# Patient Record
Sex: Female | Born: 1990 | Race: White | Hispanic: No | Marital: Single | Smoking: Never smoker
Health system: Southern US, Community
[De-identification: ages and names within clinical notes are randomized; demographics above are authoritative.]

## PROBLEM LIST (undated history)

## (undated) DIAGNOSIS — E282 Polycystic ovarian syndrome: Secondary | ICD-10-CM

## (undated) DIAGNOSIS — K219 Gastro-esophageal reflux disease without esophagitis: Secondary | ICD-10-CM

## (undated) DIAGNOSIS — L0292 Furuncle, unspecified: Secondary | ICD-10-CM

---

## 2015-10-21 ENCOUNTER — Emergency Department (INDEPENDENT_AMBULATORY_CARE_PROVIDER_SITE_OTHER)
Admission: EM | Admit: 2015-10-21 | Discharge: 2015-10-21 | Disposition: A | Discharge status: Home / Self Care | Payer: Self-pay | Source: Home / Self Care

## 2015-10-21 ENCOUNTER — Encounter (HOSPITAL_COMMUNITY): Payer: Self-pay

## 2015-10-21 DIAGNOSIS — J029 Acute pharyngitis, unspecified: Secondary | ICD-10-CM

## 2015-10-21 LAB — POCT RAPID STREP A: Streptococcus, Group A Screen (Direct): NEGATIVE

## 2015-10-21 MED ORDER — AMOXICILLIN 250 MG PO CAPS: 250.0000 mg | ORAL_CAPSULE | Freq: Two times a day (BID) | ORAL | Status: DC

## 2015-10-21 NOTE — ED Notes (Signed)
PA eval only 

## 2015-10-21 NOTE — Discharge Instructions (Signed)

## 2015-10-21 NOTE — ED Provider Notes (Signed)
CSN: [id number]     Arrival date & time 10/21/15  1619 History   None    No chief complaint on file.  (Consider location/radiation/quality/duration/timing/severity/associated sxs/prior Treatment) HPI History obtained from patient:   LOCATION:throat SEVERITY: DURATION:since last night CONTEXT:runny nose over the last couple of days QUALITY: MODIFYING FACTORS: ASSOCIATED SYMPTOMS: Saturday morning vomiting  TIMING:now constant OCCUPATION:clerk at hotel  No past medical history on file. No past surgical history on file. No family history on file. Social History  Substance Use Topics  . Smoking status: Not on file  . Smokeless tobacco: Not on file  . Alcohol Use: Not on file   OB History    No data available     Review of Systems ROS +'ve sore throat  Denies: HEADACHE, NAUSEA, ABDOMINAL PAIN, CHEST PAIN, CONGESTION, DYSURIA, SHORTNESS OF BREATH  Allergies  Review of patient's allergies indicates not on file.  Home Medications   Prior to Admission medications   Not on File   Meds Ordered and Administered this Visit  Medications - No data to display  There were no vitals taken for this visit. No data found.   Physical Exam  Constitutional: She is oriented to person, place, and time. She appears well-developed and well-nourished.  HENT:  Head: Normocephalic and atraumatic.  Right Ear: External ear normal.  Left Ear: External ear normal.  Mouth/Throat: Oropharynx is clear and moist.  Eyes: Conjunctivae are normal.  Neck: Normal range of motion. Neck supple.  Pulmonary/Chest: Effort normal and breath sounds normal.  Abdominal: Soft.  Musculoskeletal: Normal range of motion.  Neurological: She is alert and oriented to person, place, and time.  Skin: Skin is warm and dry.  Psychiatric: She has a normal mood and affect. Her behavior is normal. Judgment and thought content normal.  Nursing note and vitals reviewed.   ED Course  Procedures (including critical  care time)  Labs Review Labs Reviewed  POCT RAPID STREP A    Imaging Review No results found.   Visual Acuity Review  Right Eye Distance:   Left Eye Distance:   Bilateral Distance:    Right Eye Near:   Left Eye Near:    Bilateral Near:         MDM   1. Pharyngitis   Patient is advised to continue home symptomatic treatment. Prescription for amoxil is provided to the patient. Patient is advised that if there are new or worsening symptoms or attend the emergency department, or contact primary care provider. Instructions of care provided discharged home in stable condition.  THIS NOTE WAS GENERATED USING A VOICE RECOGNITION SOFTWARE PROGRAM. ALL REASONABLE EFFORTS  WERE MADE TO PROOFREAD THIS DOCUMENT FOR ACCURACY.     Tharon AquasFrank C Patrick, [place] 10/21/15 2037

## 2015-10-24 LAB — CULTURE, GROUP A STREP

## 2016-09-22 ENCOUNTER — Encounter (HOSPITAL_COMMUNITY): Payer: Self-pay | Admitting: Emergency Medicine

## 2016-09-22 ENCOUNTER — Telehealth (HOSPITAL_COMMUNITY): Payer: Self-pay | Admitting: Emergency Medicine

## 2016-09-22 ENCOUNTER — Ambulatory Visit (HOSPITAL_COMMUNITY)
Admission: EM | Admit: 2016-09-22 | Discharge: 2016-09-22 | Disposition: A | Discharge status: Home / Self Care | Payer: BLUE CROSS/BLUE SHIELD | Attending: Emergency Medicine | Admitting: Emergency Medicine

## 2016-09-22 DIAGNOSIS — H6690 Otitis media, unspecified, unspecified ear: Secondary | ICD-10-CM | POA: Diagnosis not present

## 2016-09-22 MED ORDER — AMOXICILLIN 500 MG PO CAPS: 1000.0000 mg | ORAL_CAPSULE | Freq: Two times a day (BID) | ORAL | 0 refills | Status: DC

## 2016-09-22 NOTE — ED Triage Notes (Signed)
[facility] with a complaint of a sore throat that started 5 days ago that has now become bilateral ear pain x 2 days. The patient denied fever at home.

## 2016-09-22 NOTE — Discharge Instructions (Signed)
You have a sinus infection, and are developing ear infections. Take amoxicillin twice a day for 10 days. Use nasal saline spray and Flonase to help with the congestion. Mucinex will also help with the congestion. Hot or cold liquids, whichever feels better on her throat. Tylenol or ibuprofen as needed for aches. Follow up as needed.

## 2016-09-22 NOTE — Telephone Encounter (Signed)
Per Lincoln Brighamony, B, RN.... E-Rx medication to [facility] [address])  Alinda Moneyony notified pt here in office.

## 2016-09-22 NOTE — ED Provider Notes (Signed)
[facility]    CSN: [id number] Arrival date & time: 09/22/16  1315     History   Chief Complaint Chief Complaint  Patient presents with  . Otalgia    HPI Christine Heath is a 25 y.o. female.   HPI She is a 25 year old woman here for evaluation of ear pain. Her symptoms started about 5 days ago with nasal congestion, sinus drainage, and sore throat. Over the last day or 2, she has developed pain in both of her ears, left worse than right. She reports a mild cough, but no shortness of breath or wheezing. She reports a low-grade fever initially, but this has resolved.  History reviewed. No pertinent past medical history.  There are no active problems to display for this patient.   History reviewed. No pertinent surgical history.  OB History    No data available       Home Medications    Prior to Admission medications   Medication Sig Start Date End Date Taking? Authorizing Provider  amoxicillin (AMOXIL) 500 MG capsule Take 2 capsules (1,000 mg total) by mouth 2 (two) times daily. For 10 days 09/22/16   Charm RingsErin J Honig, MD    Family History History reviewed. No pertinent family history.  Social History Social History  Substance Use Topics  . Smoking status: Never Smoker  . Smokeless tobacco: Never Used  . Alcohol use No     Allergies   Patient has no known allergies.   Review of Systems Review of Systems As in history of present illness  Physical Exam Triage Vital Signs ED Triage Vitals [09/22/16 1409]  Enc Vitals Group     BP 109/74     Pulse Rate 100     Resp 18     Temp 98.3 F (36.8 C)     Temp Source Oral     SpO2 98 %     Weight      Height      Head Circumference      Peak Flow      Pain Score      Pain Loc      Pain Edu?      Excl. in GC?    No data found.   Updated Vital Signs BP 109/74 (BP Location: Left Arm)   Pulse 100   Temp 98.3 F (36.8 C) (Oral)   Resp 18   LMP 08/23/2016 (Exact Date)   SpO2 98%    Visual Acuity Right Eye Distance:   Left Eye Distance:   Bilateral Distance:    Right Eye Near:   Left Eye Near:    Bilateral Near:     Physical Exam  Constitutional: She is oriented to person, place, and time. She appears well-developed and well-nourished. No distress.  HENT:  Mouth/Throat: Oropharynx is clear and moist. No oropharyngeal exudate.  Nasal discharge present. Right TM with erythema to the superior aspect. Left TM is quite erythematous, but no fluid collection.  Neck: Neck supple.  Cardiovascular: Normal rate, regular rhythm and normal heart sounds.   No murmur heard. Pulmonary/Chest: Effort normal and breath sounds normal. No respiratory distress. She has no wheezes. She has no rales.  Lymphadenopathy:    She has no cervical adenopathy.  Neurological: She is alert and oriented to person, place, and time.     UC Treatments / Results  Labs (all labs ordered are listed, but only abnormal results are displayed) Labs Reviewed - No data to display  EKG  EKG Interpretation None       Radiology No results found.  Procedures Procedures (including critical care time)  Medications Ordered in UC Medications - No data to display   Initial Impression / Assessment and Plan / UC Course  I have reviewed the triage vital signs and the nursing notes.  Pertinent labs & imaging results that were available during my care of the patient were reviewed by me and considered in my medical decision making (see chart for details).  Clinical Course     Likely viral sinusitis with developing ear infections. Amoxicillin for 10 days. Discussed symptomatic treatment for nasal congestion. Follow up as needed.  Final Clinical Impressions(s) / UC Diagnoses   Final diagnoses:  Acute otitis media, unspecified otitis media type    New Prescriptions New Prescriptions   AMOXICILLIN (AMOXIL) 500 MG CAPSULE    Take 2 capsules (1,000 mg total) by mouth 2 (two) times daily. For  10 days     Charm RingsErin J Honig, MD 09/22/16 1450

## 2016-11-18 ENCOUNTER — Emergency Department (HOSPITAL_BASED_OUTPATIENT_CLINIC_OR_DEPARTMENT_OTHER): Payer: Self-pay

## 2016-11-18 ENCOUNTER — Emergency Department (HOSPITAL_BASED_OUTPATIENT_CLINIC_OR_DEPARTMENT_OTHER)
Admission: EM | Admit: 2016-11-18 | Discharge: 2016-11-18 | Disposition: A | Discharge status: Home / Self Care | Payer: Self-pay | Attending: Emergency Medicine | Admitting: Emergency Medicine

## 2016-11-18 ENCOUNTER — Encounter (HOSPITAL_BASED_OUTPATIENT_CLINIC_OR_DEPARTMENT_OTHER): Payer: Self-pay

## 2016-11-18 DIAGNOSIS — J181 Lobar pneumonia, unspecified organism: Secondary | ICD-10-CM | POA: Insufficient documentation

## 2016-11-18 DIAGNOSIS — J189 Pneumonia, unspecified organism: Secondary | ICD-10-CM

## 2016-11-18 MED ORDER — AZITHROMYCIN 250 MG PO TABS: 250.0000 mg | ORAL_TABLET | Freq: Every day | ORAL | 0 refills | Status: DC

## 2016-11-18 MED ORDER — CEFTRIAXONE SODIUM 1 G IJ SOLR
1.0000 g | Freq: Once | INTRAMUSCULAR | Status: AC
  Administered 2016-11-18: 1 g via INTRAMUSCULAR
  Filled 2016-11-18: qty 10

## 2016-11-18 MED ORDER — LIDOCAINE HCL (PF) 1 % IJ SOLN
INTRAMUSCULAR | Status: AC
  Filled 2016-11-18: qty 5

## 2016-11-18 MED ORDER — AZITHROMYCIN 250 MG PO TABS
500.0000 mg | ORAL_TABLET | Freq: Once | ORAL | Status: AC
  Administered 2016-11-18: 500 mg via ORAL
  Filled 2016-11-18: qty 2

## 2016-11-18 NOTE — Discharge Instructions (Signed)
See your Physician for recheck in 3-4 days °

## 2016-11-18 NOTE — ED Provider Notes (Signed)
[facility]   CSN: [id number] Arrival date & time: 11/18/16  1403  By signing my name below, I, Linna Darner, attest that this documentation has been prepared under the direction and in the presence of Langston Masker, [place]. Electronically Signed: Linna Darner, Scribe. 11/18/2016. 6:43 PM.  History   Chief Complaint Chief Complaint  Patient presents with  . Cough    The history is provided by the patient. No language interpreter was used.    HPI Comments: Christine Heath is a 26 y.o. female who presents to the Emergency Department complaining of a persistent cough beginning three days ago. She reports associated fever, chills, postnasal drip, body aches, and occasional wheezing. Pt notes she received the flu vaccination three days ago before her symptoms presented. No h/o breathing problems. No known contacts with similar symptoms. She denies nausea, vomiting, or any other associated symptoms.  History reviewed. No pertinent past medical history.  There are no active problems to display for this patient.   History reviewed. No pertinent surgical history.  OB History    No data available       Home Medications    Prior to Admission medications   Not on File    Family History No family history on file.  Social History Social History  Substance Use Topics  . Smoking status: Never Smoker  . Smokeless tobacco: Never Used  . Alcohol use No     Allergies   Patient has no known allergies.   Review of Systems Review of Systems  Constitutional: Positive for chills and fever.  HENT: Positive for postnasal drip.   Respiratory: Positive for cough and wheezing.   Musculoskeletal: Positive for myalgias.  All other systems reviewed and are negative.    Physical Exam Updated Vital Signs BP 133/78   Pulse 98   Temp 99.3 F (37.4 C)   Resp 16   Ht 5\' 5"  (1.651 m)   Wt 280 lb (127 kg)   LMP 11/15/2016   SpO2 99%   BMI 46.59 kg/m    Physical Exam  Constitutional: She is oriented to person, place, and time. She appears well-developed and well-nourished. No distress.  HENT:  Head: Normocephalic and atraumatic.  Eyes: Conjunctivae and EOM are normal.  Neck: Neck supple. No tracheal deviation present.  Cardiovascular: Normal rate.   Pulmonary/Chest: Effort normal. No respiratory distress. She has no wheezes. She has rhonchi.  Musculoskeletal: Normal range of motion.  Neurological: She is alert and oriented to person, place, and time.  Skin: Skin is warm and dry.  Psychiatric: She has a normal mood and affect. Her behavior is normal.  Nursing note and vitals reviewed.    ED Treatments / Results  Labs (all labs ordered are listed, but only abnormal results are displayed) Labs Reviewed - No data to display  EKG  EKG Interpretation None       Radiology No results found.  Procedures Procedures (including critical care time)  DIAGNOSTIC STUDIES: Oxygen Saturation is 99% on RA, normal by my interpretation.    COORDINATION OF CARE: 6:47 PM Discussed treatment plan with pt at bedside and pt agreed to plan.  Medications Ordered in ED Medications - No data to display   Initial Impression / Assessment and Plan / ED Course  I have reviewed the triage vital signs and the nursing notes.  Pertinent labs & imaging results that were available during my care of the patient were reviewed by me and considered in my  medical decision making (see chart for details).       Final Clinical Impressions(s) / ED Diagnoses   Final diagnoses:  Community acquired pneumonia of right upper lobe of lung (HCC)    New Prescriptions Discharge Medication List as of 11/18/2016  7:35 PM    START taking these medications   Details  azithromycin (ZITHROMAX) 250 MG tablet Take 1 tablet (250 mg total) by mouth daily. Take first 2 tablets together, then 1 every day until finished., Starting Wed 11/18/2016, Print       An  After Visit Summary was printed and given to the patient.  I personally performed the services in this documentation, which was scribed in my presence.  The recorded information has been reviewed and considered.   Barnet PallKaren SofiaPAC.   Lonia SkinnerLeslie K RaleighSofia, PA-C 11/19/16 1612    Loren Raceravid Yelverton, MD 11/20/16 [phone]

## 2016-11-18 NOTE — ED Triage Notes (Signed)
C/o cough x 3 days-NAD-steady gait 

## 2016-11-18 NOTE — ED Notes (Addendum)
Pt reports TMax of 100.3 last PM. Pt also reports generalized body aches.

## 2017-07-15 ENCOUNTER — Encounter (HOSPITAL_COMMUNITY): Payer: Self-pay | Admitting: Emergency Medicine

## 2017-07-15 ENCOUNTER — Ambulatory Visit (HOSPITAL_COMMUNITY)
Admission: EM | Admit: 2017-07-15 | Discharge: 2017-07-15 | Disposition: A | Discharge status: Home / Self Care | Payer: BLUE CROSS/BLUE SHIELD | Attending: Family Medicine | Admitting: Family Medicine

## 2017-07-15 DIAGNOSIS — H6091 Unspecified otitis externa, right ear: Secondary | ICD-10-CM

## 2017-07-15 MED ORDER — NEOMYCIN-POLYMYXIN-HC 3.5-10000-1 OT SUSP: 4.0000 [drp] | Freq: Three times a day (TID) | OTIC | 1 refills | Status: DC

## 2017-07-15 NOTE — ED Provider Notes (Signed)
  [facility]   [id number] 07/15/17 Arrival Time: 1555   SUBJECTIVE:  Christine Heath is a 26 y.o. female who presents to the urgent care with complaint of left ear canal pain. She has known at the left ear canal has had problems with otitis externa in the past.  She's currently taking Keflex for a abscess.  Patient works as a Production designer, theatre/television/film for a [employer].     History reviewed. No pertinent past medical history. No family history on file. Social History   Social History  . Marital status: Single    Spouse name: N/A  . Number of children: N/A  . Years of education: N/A   Occupational History  . Not on file.   Social History Main Topics  . Smoking status: Never Smoker  . Smokeless tobacco: Never Used  . Alcohol use No  . Drug use: No  . Sexual activity: Not on file   Other Topics Concern  . Not on file   Social History Narrative  . No narrative on file   No outpatient prescriptions have been marked as taking for the 07/15/17 encounter [facility] Encounter).   No Known Allergies    ROS: As per HPI, remainder of ROS negative.   OBJECTIVE:   Vitals:   07/15/17 1633 07/15/17 1635  BP:  122/76  Pulse:  91  Resp:  16  Temp:  99.1 F (37.3 C)  TempSrc:  Oral  SpO2:  100%  Weight: 280 lb (127 kg)   Height:  (1.651 m)      General appearance: alert; no distress Eyes: PERRL; EOMI; conjunctiva normal HENT: normocephalic; atraumatic; TMs normal, canal eczematous with erythema on right only, external ears without trauma; nasal mucosa normal; oral mucosa normal Neck: supple, tender right anterior cervical node Back: no CVA tenderness Extremities: no cyanosis or edema; symmetrical with no gross deformities Skin: warm and dry Neurologic: normal gait; grossly normal Psychological: alert and cooperative; normal mood and affect      Labs:  Results for orders placed or performed during the hospital encounter of 10/21/15  Culture, Group A Strep    Result Value Ref Range   Strep A Culture Comment (A)   POCT rapid strep A [facility])  Result Value Ref Range   Streptococcus, Group A Screen (Direct) NEGATIVE NEGATIVE    Labs Reviewed - No data to display  No results found.     ASSESSMENT & PLAN:  1. Otitis externa of right ear, unspecified chronicity, unspecified type     Meds ordered this encounter  Medications  . neomycin-polymyxin-hydrocortisone (CORTISPORIN) 3.5-10000-1 OTIC suspension    Sig: Place 4 drops into the right ear 3 (three) times daily.    Dispense:  10 mL    Refill:  1    Reviewed expectations re: course of current medical issues. Questions answered. Outlined signs and symptoms indicating need for more acute intervention. Patient verbalized understanding. After Visit Summary given.    Procedures:      Elvina Sidle, MD 07/15/17 [phone]

## 2017-07-15 NOTE — ED Triage Notes (Signed)
PT gets frequent ear infections. Right ear has been bothering her for 3-4 days.

## 2017-11-02 IMAGING — CR DG CHEST 2V
2 series · 2 of 2 positions shown · non-contrast
Comparison: None.

CLINICAL DATA: Acute onset of cough, fever and generalized chills.
Initial encounter.

EXAM:
CHEST  2 VIEW

[w chest pa *]
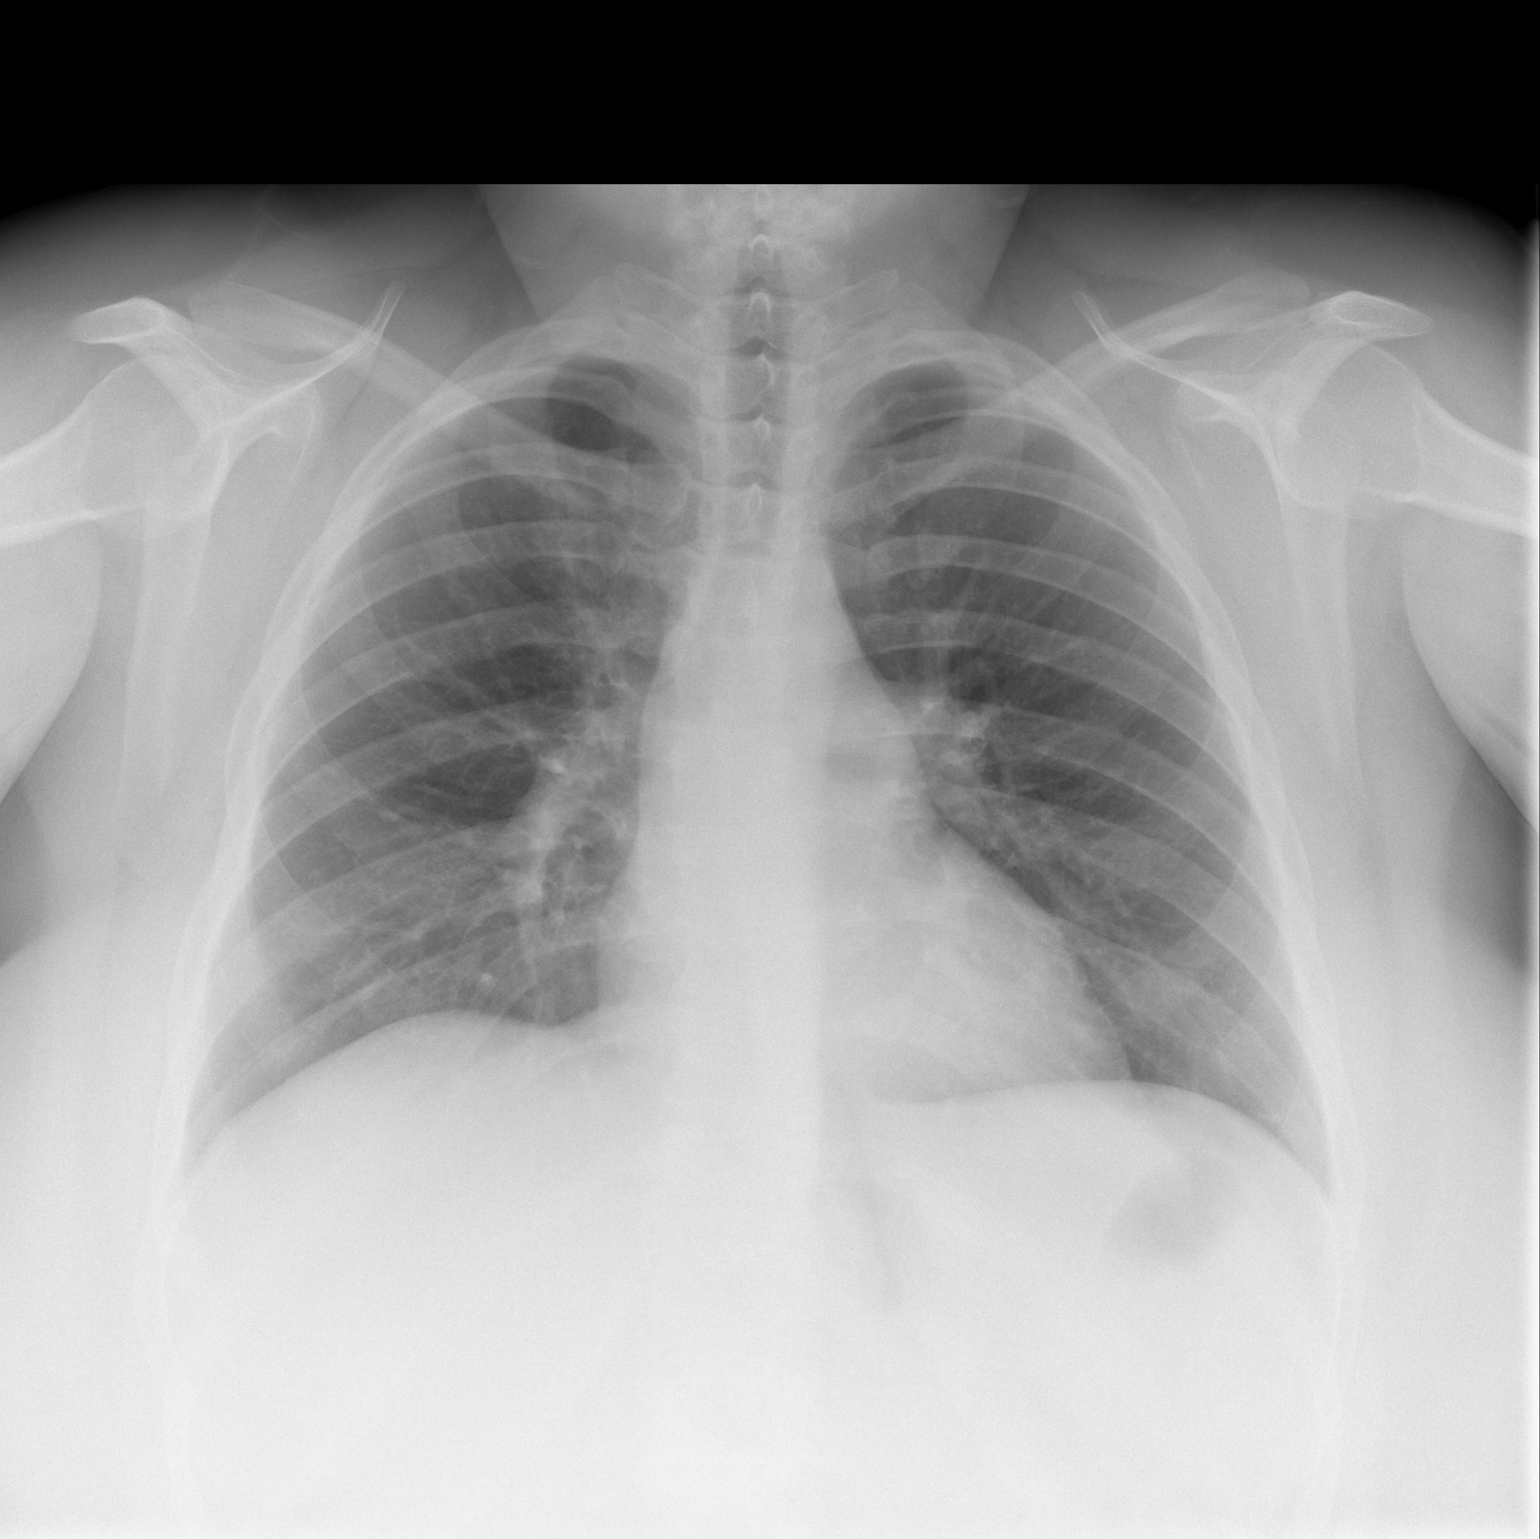

[w chest lat]
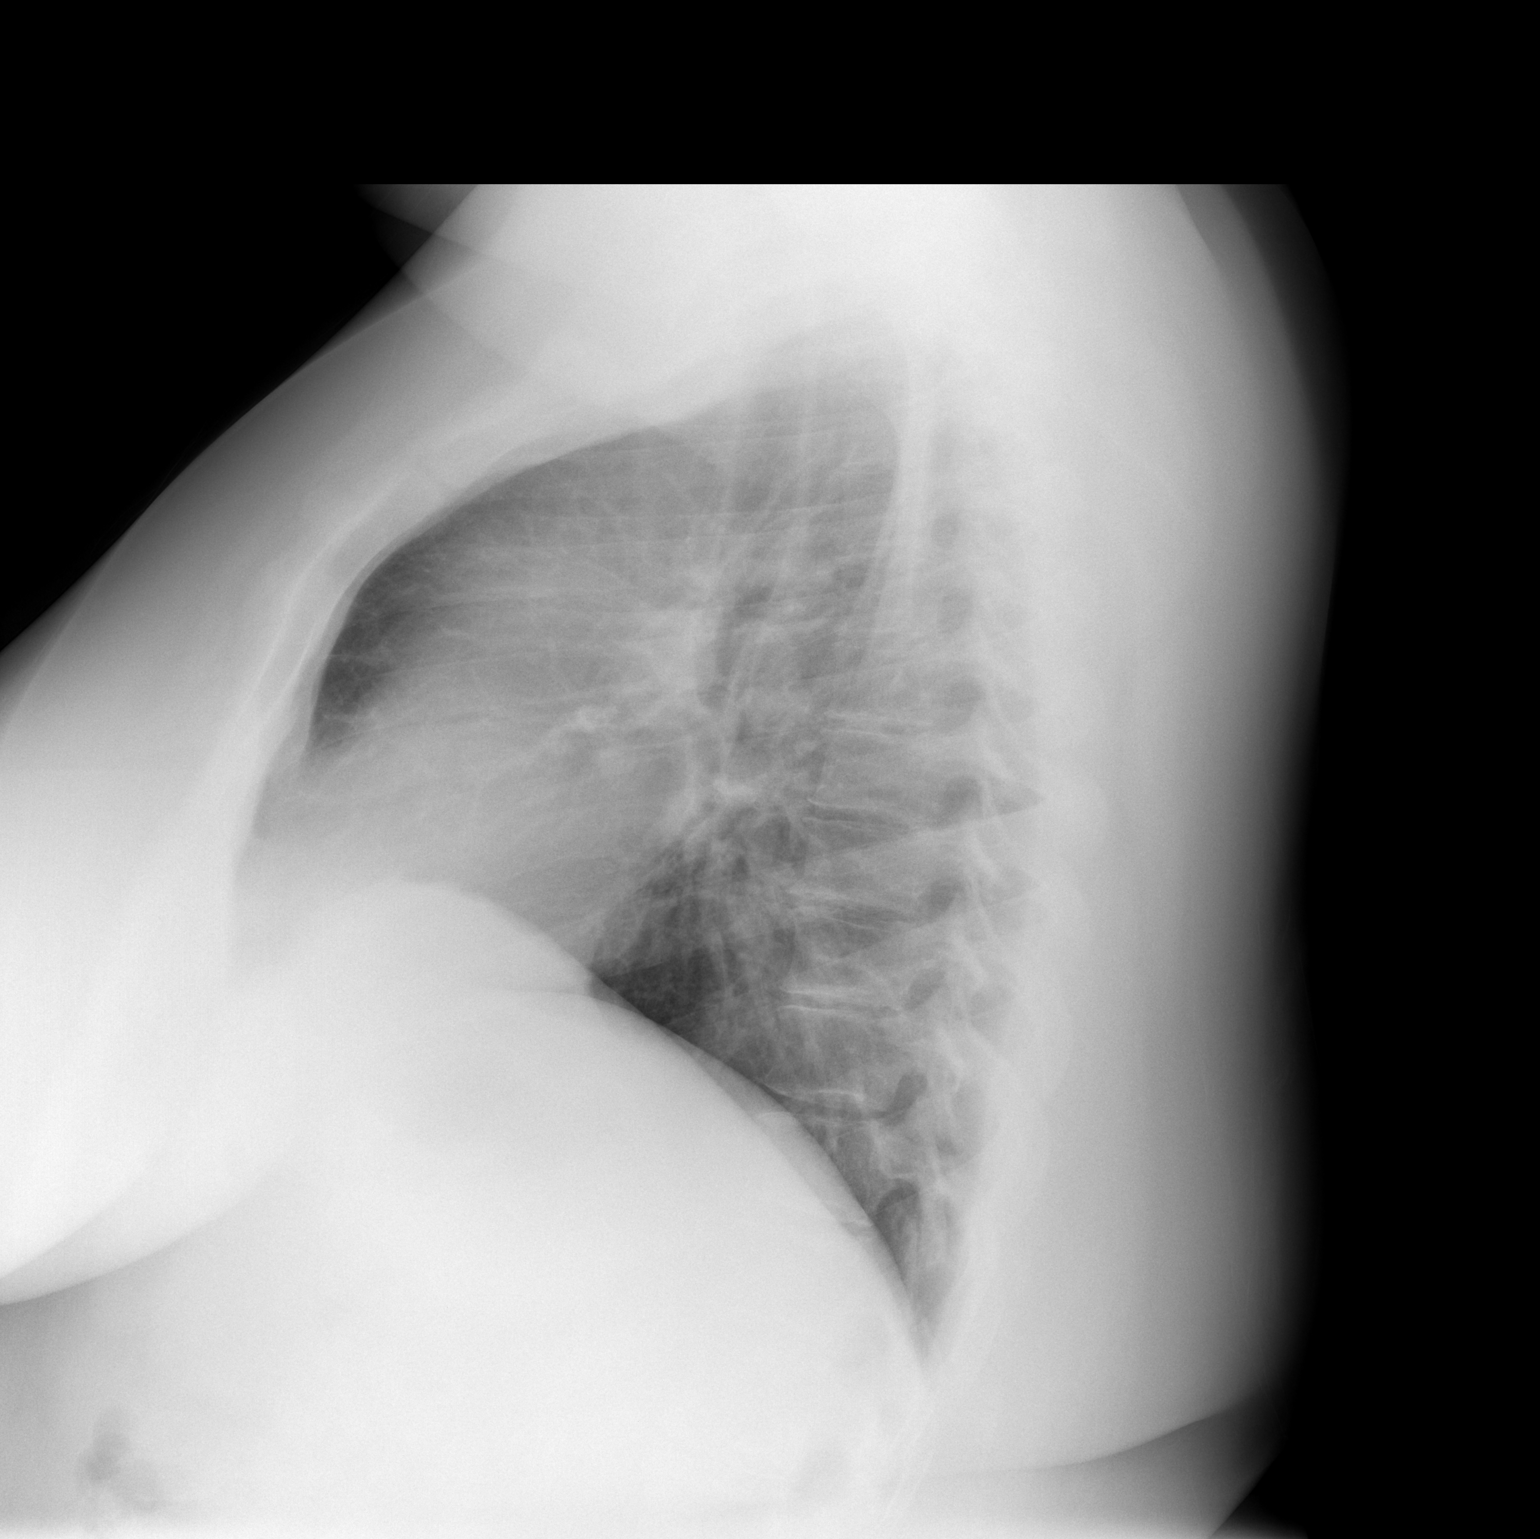

[2 of 2 positions shown; findings below may reference images not displayed]

FINDINGS: The lungs are well-aerated. Mild right suprahilar opacity could
reflect mild pneumonia, depending on the patient's symptoms. There
is no evidence of pleural effusion or pneumothorax.

The heart is normal in size; the mediastinal contour is within
normal limits. No acute osseous abnormalities are seen.
IMPRESSION: Mild right suprahilar opacity could reflect mild pneumonia,
depending on the patient's symptoms.

## 2017-11-18 ENCOUNTER — Encounter (HOSPITAL_COMMUNITY): Payer: Self-pay | Admitting: Emergency Medicine

## 2017-11-18 ENCOUNTER — Ambulatory Visit (HOSPITAL_COMMUNITY)
Admission: EM | Admit: 2017-11-18 | Discharge: 2017-11-18 | Disposition: A | Discharge status: Home / Self Care | Payer: BLUE CROSS/BLUE SHIELD | Attending: Family Medicine | Admitting: Family Medicine

## 2017-11-18 ENCOUNTER — Other Ambulatory Visit: Payer: Self-pay

## 2017-11-18 DIAGNOSIS — J039 Acute tonsillitis, unspecified: Secondary | ICD-10-CM

## 2017-11-18 MED ORDER — ALBUTEROL SULFATE HFA 108 (90 BASE) MCG/ACT IN AERS: 2.0000 | INHALATION_SPRAY | RESPIRATORY_TRACT | 1 refills | Status: DC | PRN

## 2017-11-18 MED ORDER — AMOXICILLIN-POT CLAVULANATE 875-125 MG PO TABS: 1.0000 | ORAL_TABLET | Freq: Two times a day (BID) | ORAL | 0 refills | Status: DC

## 2017-11-18 MED ORDER — FLUCONAZOLE 150 MG PO TABS: 150.0000 mg | ORAL_TABLET | Freq: Once | ORAL | 0 refills | Status: AC

## 2017-11-18 NOTE — ED Triage Notes (Signed)
Symptoms started 5 days ago.  Complains of chills, body aches, head stuffiness, sore throat and cough.

## 2017-11-18 NOTE — ED Provider Notes (Signed)
[facility]   [id number] 11/18/17 Arrival Time: 1511   SUBJECTIVE:  Christine Heath is a 27 y.o. female who presents to the urgent care with complaint of chills, body aches, head stuffiness, sore throat and cough.  symptoms began 5 days ago. Last year she had pneumonia Positive h/o asthma No fever.  Some shortness of breath  Works for a hotel.  History reviewed. No pertinent past medical history. Family History  Problem Relation Age of Onset  . Hypertension Father    Social History   Socioeconomic History  . Marital status: Single    Spouse name: Not on file  . Number of children: Not on file  . Years of education: Not on file  . Highest education level: Not on file  Social Needs  . Financial resource strain: Not on file  . Food insecurity - worry: Not on file  . Food insecurity - inability: Not on file  . Transportation needs - medical: Not on file  . Transportation needs - non-medical: Not on file  Occupational History  . Not on file  Tobacco Use  . Smoking status: Never Smoker  . Smokeless tobacco: Never Used  Substance and Sexual Activity  . Alcohol use: No  . Drug use: No  . Sexual activity: Not on file  Other Topics Concern  . Not on file  Social History Narrative  . Not on file   Current Meds  Medication Sig  . fexofenadine (ALLEGRA) 60 MG tablet Take 60 mg by mouth 2 (two) times daily.  . NON FORMULARY   . NON FORMULARY    No Known Allergies    ROS: As per HPI, remainder of ROS negative.   OBJECTIVE:   Vitals:   11/18/17 1602  BP: 121/82  Pulse: 90  Resp: (!) 22  Temp: 98.2 F (36.8 C)  TempSrc: Oral  SpO2: 98%     General appearance: alert; no distress Eyes: PERRL; EOMI; conjunctiva normal HENT: normocephalic; atraumatic; TMs normal, canal normal, external ears normal without trauma; nasal mucosa normal; oral mucosa normal Neck: supple Lungs: clear to auscultation bilaterally Heart: regular rate and rhythm Back: no  CVA tenderness Extremities: no cyanosis or edema; symmetrical with no gross deformities Skin: warm and dry Neurologic: normal gait; grossly normal Psychological: alert and cooperative; normal mood and affect      Labs:  Results for orders placed or performed during the hospital encounter of 10/21/15  Culture, Group A Strep  Result Value Ref Range   Strep A Culture Comment (A)   POCT rapid strep A [facility]  Result Value Ref Range   Streptococcus, Group A Screen (Direct) NEGATIVE NEGATIVE    Labs Reviewed - No data to display  No results found.     ASSESSMENT & PLAN:  1. Acute tonsillitis, unspecified etiology     Meds ordered this encounter  Medications  . amoxicillin-clavulanate (AUGMENTIN) 875-125 MG tablet    Sig: Take 1 tablet by mouth every 12 (twelve) hours.    Dispense:  14 tablet    Refill:  0  . fluconazole (DIFLUCAN) 150 MG tablet    Sig: Take 1 tablet (150 mg total) by mouth once for 1 dose. Repeat if needed    Dispense:  2 tablet    Refill:  0  . albuterol (PROVENTIL HFA;VENTOLIN HFA) 108 (90 Base) MCG/ACT inhaler    Sig: Inhale 2 puffs into the lungs every 4 (four) hours as needed for wheezing or shortness of breath (  cough, shortness of breath or wheezing.).    Dispense:  1 Inhaler    Refill:  1    Reviewed expectations re: course of current medical issues. Questions answered. Outlined signs and symptoms indicating need for more acute intervention. Patient verbalized understanding. After Visit Summary given.    Procedures:      Elvina Sidle, MD 11/18/17 [phone]

## 2018-03-24 ENCOUNTER — Encounter (HOSPITAL_COMMUNITY): Payer: Self-pay | Admitting: Emergency Medicine

## 2018-03-24 ENCOUNTER — Ambulatory Visit (HOSPITAL_COMMUNITY)
Admission: EM | Admit: 2018-03-24 | Discharge: 2018-03-24 | Disposition: A | Discharge status: Home / Self Care | Payer: BLUE CROSS/BLUE SHIELD | Attending: Family Medicine | Admitting: Family Medicine

## 2018-03-24 DIAGNOSIS — B349 Viral infection, unspecified: Secondary | ICD-10-CM

## 2018-03-24 MED ORDER — FLUTICASONE PROPIONATE 50 MCG/ACT NA SUSP: 2.0000 | Freq: Every day | NASAL | 0 refills | Status: DC

## 2018-03-24 MED ORDER — BENZONATATE 100 MG PO CAPS: 100.0000 mg | ORAL_CAPSULE | Freq: Three times a day (TID) | ORAL | 0 refills | Status: DC

## 2018-03-24 MED ORDER — CETIRIZINE HCL 10 MG PO TABS: 10.0000 mg | ORAL_TABLET | Freq: Every day | ORAL | 0 refills | Status: DC

## 2018-03-24 MED ORDER — IPRATROPIUM BROMIDE 0.06 % NA SOLN: 2.0000 | Freq: Four times a day (QID) | NASAL | 0 refills | Status: DC

## 2018-03-24 NOTE — ED Triage Notes (Signed)
Pt c/o sore throat, head pain, facial pain, RUQ stabbing stomach pain. Denies pain at this time in abdomen.

## 2018-03-24 NOTE — Discharge Instructions (Signed)
Tessalon for cough. Start flonase, atrovent nasal spray, zyrtec for nasal congestion/drainage. You can use over the counter nasal saline rinse such as neti pot for nasal congestion. Keep hydrated, your urine should be clear to pale yellow in color. Tylenol/motrin for fever and pain. Monitor for any worsening of symptoms, chest pain, shortness of breath, wheezing, swelling of the throat, follow up for reevaluation.  ° °For sore throat try using a honey-based tea. Use 3 teaspoons of honey with juice squeezed from half lemon. Place shaved pieces of ginger into 1/2-1 cup of water and warm over stove top. Then mix the ingredients and repeat every 4 hours as needed. ° °

## 2018-03-24 NOTE — ED Provider Notes (Signed)
[facility]    CSN: [id number] Arrival date & time: 03/24/18  1457     History   Chief Complaint Chief Complaint  Patient presents with  . Abdominal Pain  . Sore Throat    HPI Christine Heath is a 27 y.o. female.   27 year old female comes in for 2-day history of URI symptoms.  She has had nonproductive cough, sore throat, sinus pressure, nasal congestion.  Has had subjective fever.  Denies allergy symptoms such as sneezing, watery/itching eyes.  States has some right upper quadrant pain that she thinks is due to cough, has now resolved, denies current abdominal pain.  States she usually gets  this kind of pain during URI symptoms.  Nausea without vomiting.  Has still been able to eat and drink without problems.  Denies diarrhea.  NyQuil/DayQuil with some relief.  Never smoker.  Prior to symptom onset, patient denies recurrent right upper quadrant pains that is exacerbated by fatty foods.      History reviewed. No pertinent past medical history.  There are no active problems to display for this patient.   History reviewed. No pertinent surgical history.  OB History   None      Home Medications    Prior to Admission medications   Medication Sig Start Date End Date Taking? Authorizing Provider  albuterol (PROVENTIL HFA;VENTOLIN HFA) 108 (90 Base) MCG/ACT inhaler Inhale 2 puffs into the lungs every 4 (four) hours as needed for wheezing or shortness of breath (cough, shortness of breath or wheezing.). 11/18/17   Elvina Sidle, MD  amoxicillin-clavulanate (AUGMENTIN) 875-125 MG tablet Take 1 tablet by mouth every 12 (twelve) hours. Patient not taking: Reported on 03/24/2018 11/18/17   Elvina Sidle, MD  benzonatate (TESSALON) 100 MG capsule Take 1 capsule (100 mg total) by mouth every 8 (eight) hours. 03/24/18   Cathie Hoops, Destony Prevost V, PA-C  cetirizine (ZYRTEC) 10 MG tablet Take 1 tablet (10 mg total) by mouth daily. 03/24/18   Cathie Hoops, Skylor Schnapp V, PA-C  fexofenadine (ALLEGRA) 60 MG  tablet Take 60 mg by mouth 2 (two) times daily.    [provider]  fluticasone (FLONASE) 50 MCG/ACT nasal spray Place 2 sprays into both nostrils daily. 03/24/18   Cathie Hoops, Annsleigh Dragoo V, PA-C  ipratropium (ATROVENT) 0.06 % nasal spray Place 2 sprays into both nostrils 4 (four) times daily. 03/24/18   Belinda Fisher, PA-C  NON FORMULARY     [provider]  NON FORMULARY     [provider]    Family History Family History  Problem Relation Age of Onset  . Hypertension Father     Social History Social History   Tobacco Use  . Smoking status: Never Smoker  . Smokeless tobacco: Never Used  Substance Use Topics  . Alcohol use: No  . Drug use: No     Allergies   Patient has no known allergies.   Review of Systems Review of Systems  Reason unable to perform ROS: See HPI as above.     Physical Exam Triage Vital Signs ED Triage Vitals [03/24/18 1511]  Enc Vitals Group     BP (!) 134/52     Pulse Rate 92     Resp 18     Temp 98.5 F (36.9 C)     Temp src      SpO2 100 %     Weight      Height      Head Circumference      Peak  Flow      Pain Score      Pain Loc      Pain Edu?      Excl. in GC?    No data found.  Updated Vital Signs BP (!) 134/52   Pulse 92   Temp 98.5 F (36.9 C)   Resp 18   SpO2 100%   Physical Exam  Constitutional: She is oriented to person, place, and time. She appears well-developed and well-nourished.  Non-toxic appearance. She does not appear ill. No distress.  HENT:  Head: Normocephalic and atraumatic.  Right Ear: External ear and ear canal normal. Tympanic membrane is erythematous. Tympanic membrane is not bulging.  Left Ear: External ear and ear canal normal. Tympanic membrane is erythematous. Tympanic membrane is not bulging.  Nose: Mucosal edema present. Right sinus exhibits maxillary sinus tenderness and frontal sinus tenderness. Left sinus exhibits maxillary sinus tenderness and frontal sinus tenderness.    Mouth/Throat: Uvula is midline, oropharynx is clear and moist and mucous membranes are normal.  Eyes: Pupils are equal, round, and reactive to light. Conjunctivae are normal.  Neck: Normal range of motion. Neck supple.  Cardiovascular: Normal rate, regular rhythm and normal heart sounds. Exam reveals no gallop and no friction rub.  No murmur heard. Pulmonary/Chest: Effort normal and breath sounds normal. She has no decreased breath sounds. She has no wheezes. She has no rhonchi. She has no rales.  Abdominal: Soft. Bowel sounds are normal. There is no tenderness. There is no rigidity, no rebound, no guarding and no CVA tenderness.  Lymphadenopathy:    She has no cervical adenopathy.  Neurological: She is alert and oriented to person, place, and time.  Skin: Skin is warm and dry.  Psychiatric: She has a normal mood and affect. Her behavior is normal. Judgment normal.     UC Treatments / Results  Labs (all labs ordered are listed, but only abnormal results are displayed) Labs Reviewed - No data to display  EKG None  Radiology No results found.  Procedures Procedures (including critical care time)  Medications Ordered in UC Medications - No data to display  Initial Impression / Assessment and Plan / UC Course  I have reviewed the triage vital signs and the nursing notes.  Pertinent labs & imaging results that were available during my care of the patient were reviewed by me and considered in my medical decision making (see chart for details).    Discussed with patient history and exam most consistent with viral URI. Symptomatic treatment as needed. Push fluids. Return precautions given.   Final Clinical Impressions(s) / UC Diagnoses   Final diagnoses:  Viral illness    ED Prescriptions    Medication Sig Dispense Auth. Provider   benzonatate (TESSALON) 100 MG capsule Take 1 capsule (100 mg total) by mouth every 8 (eight) hours. 21 capsule Everley Evora V, PA-C   fluticasone  (FLONASE) 50 MCG/ACT nasal spray Place 2 sprays into both nostrils daily. 1 g Brittannie Tawney V, PA-C   ipratropium (ATROVENT) 0.06 % nasal spray Place 2 sprays into both nostrils 4 (four) times daily. 15 mL Samauri Kellenberger V, PA-C   cetirizine (ZYRTEC) 10 MG tablet Take 1 tablet (10 mg total) by mouth daily. 15 tablet Threasa Alpha, [place] 03/24/18 1550

## 2020-04-28 ENCOUNTER — Other Ambulatory Visit: Payer: Self-pay

## 2020-04-28 ENCOUNTER — Encounter (HOSPITAL_BASED_OUTPATIENT_CLINIC_OR_DEPARTMENT_OTHER): Payer: Self-pay | Admitting: Emergency Medicine

## 2020-04-28 ENCOUNTER — Emergency Department (HOSPITAL_BASED_OUTPATIENT_CLINIC_OR_DEPARTMENT_OTHER)
Admission: EM | Admit: 2020-04-28 | Discharge: 2020-04-28 | Disposition: A | Discharge status: Home / Self Care | Payer: BLUE CROSS/BLUE SHIELD | Attending: Emergency Medicine | Admitting: Emergency Medicine

## 2020-04-28 DIAGNOSIS — N611 Abscess of the breast and nipple: Secondary | ICD-10-CM

## 2020-04-28 HISTORY — DX: Furuncle, unspecified: L02.92

## 2020-04-28 HISTORY — DX: Gastro-esophageal reflux disease without esophagitis: K21.9

## 2020-04-28 MED ORDER — IBUPROFEN 800 MG PO TABS: 800.0000 mg | ORAL_TABLET | Freq: Once | ORAL | Status: AC

## 2020-04-28 MED ORDER — ACETAMINOPHEN 500 MG PO TABS: 1000.0000 mg | ORAL_TABLET | Freq: Once | ORAL | Status: DC

## 2020-04-28 MED ORDER — LIDOCAINE HCL 2 % IJ SOLN
INTRAMUSCULAR | Status: AC
  Filled 2020-04-28: qty 20

## 2020-04-28 MED ORDER — IBUPROFEN 800 MG PO TABS
ORAL_TABLET | ORAL | Status: AC
  Administered 2020-04-28: 800 mg via ORAL
  Filled 2020-04-28: qty 1

## 2020-04-28 MED ORDER — KETOROLAC TROMETHAMINE 60 MG/2ML IM SOLN: 30.0000 mg | Freq: Once | INTRAMUSCULAR | Status: DC

## 2020-04-28 NOTE — Discharge Instructions (Addendum)
Continue taking your antibiotics as prescribed.  You may take over-the-counter Tylenol or Motrin for pain.

## 2020-04-28 NOTE — ED Notes (Signed)
I & D tray with supplies at bedside

## 2020-04-28 NOTE — ED Triage Notes (Signed)
Pt has abscess to her chest next to right breast.  Noted blister on top of abscess.  Pt states it has been there for over 2 weeks but is worsening.  Seen at [facility] for same and was placed on antibiotics.  Blister appeared today.

## 2020-04-28 NOTE — ED Provider Notes (Signed)
[facility] EMERGENCY DEPARTMENT Provider Note  CSN: [id number] Arrival date & time: 04/28/20 0121  Chief Complaint(s) Abscess  HPI Christine Heath is a 29 y.o. female   CC: Breast pain  Onset/Duration: Several days Timing: Constant and worsening Location: Right medial breast Quality: Aching/stabbing pain Severity: Severe Modifying Factors:  Improved by: Nothing  Worsened by: Palpating Associated Signs/Symptoms:  Pertinent (+): Erythema and blister  Pertinent (-): Fevers, chills, nausea, vomiting, shortness of breath.  No wounds. Context: Patient was seen at urgent care prescribed Bactrim for abscess.  Patient has been applying warm compresses.  Today she developed a large blister.   HPI  Past Medical History Past Medical History:  Diagnosis Date   Boils    GERD (gastroesophageal reflux disease)    There are no problems to display for this patient.  Home Medication(s) Prior to Admission medications   Medication Sig Start Date End Date Taking? Authorizing Provider  sulfamethoxazole-trimethoprim (BACTRIM DS) 800-160 MG tablet Take by mouth. 04/24/20 05/04/20 Yes [provider]  Multiple Vitamin (MULTIVITAMIN ADULT PO) Take by mouth.    [provider]  omeprazole (PRILOSEC) 20 MG capsule omeprazole 20 mg capsule,delayed release  Take 1 capsule every day by oral route.    [provider]  Turmeric (QC TUMERIC COMPLEX PO) turmeric (bulk)  2 capsules daily    [provider]                                                                                                                                    Past Surgical History History reviewed. No pertinent surgical history. Family History Family History  Problem Relation Age of Onset   Hypertension Father     Social History Social History   Tobacco Use   Smoking status: Never Smoker   Smokeless tobacco: Never Used  Substance Use Topics   Alcohol use: No   Drug  use: No   Allergies Nicotine  Review of Systems Review of Systems All other systems are reviewed and are negative for acute change except as noted in the HPI  Physical Exam Vital Signs  I have reviewed the triage vital signs BP (!) (P) 155/96 (BP Location: Right Arm)    Pulse (P) 89    Temp (P) 99.3 F (37.4 C) (Oral)    Resp (P) 20    Ht (P) 5\' 6"  (1.676 m)    LMP 04/22/2020    SpO2 (P) 100%    BMI (P) 45.19 kg/m   Physical Exam Vitals reviewed.  Constitutional:      General: She is not in acute distress.    Appearance: She is well-developed. She is obese. She is not diaphoretic.  HENT:     Head: Normocephalic and atraumatic.     Right Ear: External ear normal.     Left Ear: External ear normal.     Nose: Nose normal.  Eyes:  General: No scleral icterus.    Conjunctiva/sclera: Conjunctivae normal.  Neck:     Trachea: Phonation normal.  Cardiovascular:     Rate and Rhythm: Normal rate and regular rhythm.  Pulmonary:     Effort: Pulmonary effort is normal. No respiratory distress.     Breath sounds: No stridor.  Chest:    Abdominal:     General: There is no distension.  Musculoskeletal:        General: Normal range of motion.     Cervical back: Normal range of motion.  Neurological:     Mental Status: She is alert and oriented to person, place, and time.  Psychiatric:        Behavior: Behavior normal.     ED Results and Treatments Labs (all labs ordered are listed, but only abnormal results are displayed) Labs Reviewed - No data to display                                                                                                                       EKG  EKG Interpretation  Date/Time:    Ventricular Rate:    PR Interval:    QRS Duration:   QT Interval:    QTC Calculation:   R Axis:     Text Interpretation:        Radiology No results found.  Pertinent labs & imaging results that were available during my care of the patient were reviewed  by me and considered in my medical decision making (see chart for details).  Medications Ordered in ED Medications  ibuprofen (ADVIL) tablet 800 mg (800 mg Oral Given 04/28/20 0324)                                                                                                                                    Procedures Ultrasound ED Soft Tissue  Date/Time: 04/28/2020 3:22 AM Performed by: Nira Conn, MD Authorized by: Nira Conn, MD   Procedure details:    Indications: localization of abscess     Transverse view:  Visualized   Longitudinal view:  Visualized   Images: archived   Location:    Location: breast     Side:  Right Findings:     abscess present .Marland KitchenIncision and Drainage  Date/Time: 04/28/2020 3:22 AM Performed by: Nira Conn, MD Authorized by: Nira Conn, MD   Consent:    Consent  obtained:  Verbal   Consent given by:  Patient   Risks discussed:  Bleeding, infection, incomplete drainage and pain   Alternatives discussed:  Delayed treatment Location:    Type:  Abscess   Size:  2cm   Location:  Trunk   Trunk location:  R breast Pre-procedure details:    Skin preparation:  Betadine Anesthesia (see MAR for exact dosages):    Anesthesia method:  Local infiltration   Local anesthetic:  Lidocaine 1% WITH epi Procedure type:    Complexity:  Complex Procedure details:    Needle aspiration: no     Incision types:  Cruciate   Incision depth:  Dermal   Scalpel blade:  11   Wound management:  Probed and deloculated, extensive cleaning and debrided   Drainage:  Purulent   Drainage amount:  Copious   Wound treatment:  Wound left open   Packing materials:  1/4 in iodoform gauze Post-procedure details:    Patient tolerance of procedure:  Tolerated well, no immediate complications    (including critical care time)  Medical Decision Making / ED Course I have reviewed the nursing notes for this encounter and the patient's  prior records (if available in EHR or on provided paperwork).   Christine Heath was evaluated in Emergency Department on 04/28/2020 for the symptoms described in the history of present illness. She was evaluated in the context of the global COVID-19 pandemic, which necessitated consideration that the patient might be at risk for infection with the SARS-CoV-2 virus that causes COVID-19. Institutional protocols and algorithms that pertain to the evaluation of patients at risk for COVID-19 are in a state of rapid change based on information released by regulatory bodies including the CDC and federal and state organizations. These policies and algorithms were followed during the patient's care in the ED.  Right breast abscess I & D'd as above.  Patient already on Bactrim.  Recommended she complete her course of antibiotics.      Final Clinical Impression(s) / ED Diagnoses Final diagnoses:  Breast abscess   The patient appears reasonably screened and/or stabilized for discharge and I doubt any other medical condition or other [facility] requiring further screening, evaluation, or treatment in the ED at this time prior to discharge. Safe for discharge with strict return precautions.  Disposition: Discharge  Condition: Good  I have discussed the results, Dx and Tx plan with the patient/family who expressed understanding and agree(s) with the plan. Discharge instructions discussed at length. The patient/family was given strict return precautions who verbalized understanding of the instructions. No further questions at time of discharge.    ED Discharge Orders    None        Follow Up: [facility] [place] [facility] [address] [id number] [address] [place] [address] [phone] Go to  if redness spreads 1 inch beyond current borders      This chart was dictated using voice recognition software.  Despite best efforts to proofread,  errors can occur which can  change the documentation meaning.   Nira Conn, MD 04/28/20 [phone]

## 2020-04-30 ENCOUNTER — Emergency Department (HOSPITAL_BASED_OUTPATIENT_CLINIC_OR_DEPARTMENT_OTHER)
Admission: EM | Admit: 2020-04-30 | Discharge: 2020-04-30 | Disposition: A | Discharge status: Home / Self Care | Payer: Self-pay | Attending: Emergency Medicine | Admitting: Emergency Medicine

## 2020-04-30 ENCOUNTER — Encounter (HOSPITAL_BASED_OUTPATIENT_CLINIC_OR_DEPARTMENT_OTHER): Payer: Self-pay

## 2020-04-30 ENCOUNTER — Other Ambulatory Visit: Payer: Self-pay

## 2020-04-30 DIAGNOSIS — Z5189 Encounter for other specified aftercare: Secondary | ICD-10-CM

## 2020-04-30 DIAGNOSIS — Z4800 Encounter for change or removal of nonsurgical wound dressing: Secondary | ICD-10-CM | POA: Insufficient documentation

## 2020-04-30 NOTE — ED Provider Notes (Signed)
[facility] Provider Note   CSN: [id number] Arrival date & time: 04/30/20  1134     History Chief Complaint  Patient presents with  . Follow-up    Christine Heath is a 29 y.o. female who presents to the ED for follow up/wound check. Pt was seen in the ED on 07/04 for breast abscess with I&D performed with packing placed. She was told to remove the packing on her own at home however she states she could not as she was concerned she would pass out. The abscess has continued to drain. She is currently on Bactrim. She states the swelling/redness has gone down and it has improved since incision. No other complaints at this time.   The history is provided by the patient and medical records.       Past Medical History:  Diagnosis Date  . Boils   . GERD (gastroesophageal reflux disease)     There are no problems to display for this patient.   History reviewed. No pertinent surgical history.   OB History   No obstetric history on file.     Family History  Problem Relation Age of Onset  . Hypertension Father     Social History   Tobacco Use  . Smoking status: Never Smoker  . Smokeless tobacco: Never Used  Vaping Use  . Vaping Use: Never used  Substance Use Topics  . Alcohol use: No  . Drug use: No    Home Medications Prior to Admission medications   Medication Sig Start Date End Date Taking? Authorizing Provider  Multiple Vitamin (MULTIVITAMIN ADULT PO) Take by mouth.    [provider]  omeprazole (PRILOSEC) 20 MG capsule omeprazole 20 mg capsule,delayed release  Take 1 capsule every day by oral route.    [provider]  sulfamethoxazole-trimethoprim (BACTRIM DS) 800-160 MG tablet Take by mouth. 04/24/20 05/04/20  [provider]  Turmeric (QC TUMERIC COMPLEX PO) turmeric (bulk)  2 capsules daily    [provider]    Allergies    Nicotine  Review of Systems   Review of Systems   Constitutional: Negative for chills and fever.  Skin:       + abscess    Physical Exam Updated Vital Signs BP (!) 151/89 (BP Location: Left Arm)   Pulse 99   Temp 98.7 F (37.1 C) (Oral)   Resp 16   Ht 5\' 6"  (1.676 m)   Wt (!) 148.6 kg   LMP 04/22/2020   SpO2 99%   BMI 52.88 kg/m   Physical Exam Vitals and nursing note reviewed.  Constitutional:      Appearance: She is not ill-appearing.  HENT:     Head: Normocephalic and atraumatic.  Eyes:     Conjunctiva/sclera: Conjunctivae normal.  Cardiovascular:     Rate and Rhythm: Normal rate and regular rhythm.  Pulmonary:     Effort: Pulmonary effort is normal.     Breath sounds: Normal breath sounds. No wheezing, rhonchi or rales.     Comments: Small area of induration noted to right midline chest with packing in place Skin:    General: Skin is warm and dry.     Coloration: Skin is not jaundiced.  Neurological:     Mental Status: She is alert.     ED Results / Procedures / Treatments   Labs (all labs ordered are listed, but only abnormal results are displayed) Labs Reviewed - No data to display  EKG None  Radiology No results found.  Procedures Procedures (including critical care time)  Medications Ordered in ED Medications - No data to display  ED Course  I have reviewed the triage vital signs and the nursing notes.  Pertinent labs & imaging results that were available during my care of the patient were reviewed by me and considered in my medical decision making (see chart for details).    MDM Rules/Calculators/A&P                          28 year old female who presents to the ED for wound recheck.  Was seen in the ED 2 days ago for right breast abscess and had I&D performed with packing.  Patient cannot remove the packing at home due to being squeamish and came to the ED.  She states the abscess has been getting smaller in size since the incision.  It has continued to drain.  She is currently on  Bactrim prescribed by a previous provider.  No other complaints at this time.  On exam patient does have a small area of induration to her right chest with packing in place.  Packing was removed and a very small amount of purulent drainage was expressed.  I do not feel she needs repeat packing at this time.  Have advised continuation of Bactrim and warm compresses to the area.  She is instructed to keep wound covered if draining continues.  Strict return precautions have been discussed with patient.  She is in agreement with plan is stable for discharge home.   This note was prepared using Dragon voice recognition software and may include unintentional dictation errors due to the inherent limitations of voice recognition software.  Final Clinical Impression(s) / ED Diagnoses Final diagnoses:  Wound check, abscess    Rx / DC Orders ED Discharge Orders    None       Discharge Instructions     Please continue taking your antibiotics as prescribed I would recommend warm compresses to the area to help with natural softening if there is any remainder of infection  Return to the ED IMMEDIATELY for any worsening symptoms including worsening pain, worsening swelling/redness, fevers > 100.4, or any other new/concerning symptoms       Tanda Rockers, PA-C 04/30/20 1332    Raeford Razor, MD 05/01/20 [phone]

## 2020-04-30 NOTE — Discharge Instructions (Addendum)
Please continue taking your antibiotics as prescribed I would recommend warm compresses to the area to help with natural softening if there is any remainder of infection  Return to the ED IMMEDIATELY for any worsening symptoms including worsening pain, worsening swelling/redness, fevers > 100.4, or any other new/concerning symptoms

## 2020-04-30 NOTE — ED Triage Notes (Signed)
Pt states she is here for recheck of chest abscess from 7/4-NAD-steady gait

## 2021-07-21 ENCOUNTER — Emergency Department (HOSPITAL_BASED_OUTPATIENT_CLINIC_OR_DEPARTMENT_OTHER)
Admission: EM | Admit: 2021-07-21 | Discharge: 2021-07-21 | Disposition: A | Discharge status: Home / Self Care | Payer: BC Managed Care – PPO | Attending: Emergency Medicine | Admitting: Emergency Medicine

## 2021-07-21 ENCOUNTER — Encounter (HOSPITAL_BASED_OUTPATIENT_CLINIC_OR_DEPARTMENT_OTHER): Payer: Self-pay | Admitting: Urology

## 2021-07-21 ENCOUNTER — Other Ambulatory Visit: Payer: Self-pay

## 2021-07-21 DIAGNOSIS — L02213 Cutaneous abscess of chest wall: Secondary | ICD-10-CM | POA: Diagnosis not present

## 2021-07-21 DIAGNOSIS — R0789 Other chest pain: Secondary | ICD-10-CM | POA: Diagnosis present

## 2021-07-21 DIAGNOSIS — Z23 Encounter for immunization: Secondary | ICD-10-CM | POA: Insufficient documentation

## 2021-07-21 DIAGNOSIS — L0291 Cutaneous abscess, unspecified: Secondary | ICD-10-CM

## 2021-07-21 MED ORDER — FLUCONAZOLE 150 MG PO TABS
150.0000 mg | ORAL_TABLET | Freq: Every day | ORAL | 0 refills | Status: AC
Start: 2021-07-21 — End: 2021-07-23

## 2021-07-21 MED ORDER — TETANUS-DIPHTH-ACELL PERTUSSIS 5-2.5-18.5 LF-MCG/0.5 IM SUSY
0.5000 mL | PREFILLED_SYRINGE | Freq: Once | INTRAMUSCULAR | Status: AC
  Administered 2021-07-21: 0.5 mL via INTRAMUSCULAR
  Filled 2021-07-21: qty 0.5

## 2021-07-21 MED ORDER — DOXYCYCLINE HYCLATE 100 MG PO CAPS: 100.0000 mg | ORAL_CAPSULE | Freq: Two times a day (BID) | ORAL | 0 refills | Status: AC

## 2021-07-21 MED ORDER — LIDOCAINE-EPINEPHRINE (PF) 2 %-1:200000 IJ SOLN
10.0000 mL | Freq: Once | INTRAMUSCULAR | Status: AC
  Administered 2021-07-21: 10 mL
  Filled 2021-07-21: qty 20

## 2021-07-21 NOTE — Discharge Instructions (Signed)
You have been seen in the Emergency Department (ED) today for an abscess.  This was drained in the ED.  Please follow up with your doctor or in the ED in 24-48 hours for recheck of your wound.  Read through the additional discharge instructions included below regarding wound care recommendations.  Keep the wound clean an

## 2021-07-21 NOTE — ED Triage Notes (Signed)
Abscess to chest that started x 2 days ago, reportss heat from area, no drainage noted.

## 2021-07-21 NOTE — ED Notes (Signed)
ED Provider at bedside. 

## 2021-07-21 NOTE — ED Provider Notes (Signed)
Emergency Department Provider Note   I have reviewed the triage vital signs and the nursing notes.   HISTORY  Chief Complaint Abscess   HPI Christine Heath is a 30 y.o. female presents to the ED with chest wall swelling and pain. Patient with prior history of I&D for chest wall abscess. Pain worsening over the last several days. No fever. The area is described as red and hot to touch. No other areas of swelling. Some scar tissue near the prior site. No radiation of symptoms or modifying factors.    Past Medical History:  Diagnosis Date   Boils    GERD (gastroesophageal reflux disease)     There are no problems to display for this patient.   History reviewed. No pertinent surgical history.  Allergies Nicotine  Family History  Problem Relation Age of Onset   Hypertension Father     Social History Social History   Tobacco Use   Smoking status: Never   Smokeless tobacco: Never  Vaping Use   Vaping Use: Never used  Substance Use Topics   Alcohol use: No   Drug use: No    Review of Systems  Constitutional: No fever/chills Eyes: No visual changes. ENT: No sore throat. Cardiovascular: Denies chest pain. Respiratory: Denies shortness of breath. Gastrointestinal: No abdominal pain.  No nausea, no vomiting.  No diarrhea.  No constipation. Genitourinary: Negative for dysuria. Musculoskeletal: Negative for back pain. Skin: Chest wall swelling, redness, warmth, and pain.  Neurological: Negative for headaches, focal weakness or numbness.  10-point ROS otherwise negative.  ____________________________________________   PHYSICAL EXAM:  VITAL SIGNS: ED Triage Vitals  Enc Vitals Group     BP 07/21/21 0917 (!) 145/104     Pulse Rate 07/21/21 0917 99     Resp 07/21/21 0917 20     Temp 07/21/21 0917 98.3 F (36.8 C)     Temp Source 07/21/21 0917 Oral     SpO2 07/21/21 0917 100 %     Weight 07/21/21 0915 (!) 310 lb (140.6 kg)     Height 07/21/21 0915 5\' 6"   (1.676 m)   Constitutional: Alert and oriented. Well appearing and in no acute distress. Eyes: Conjunctivae are normal.  Head: Atraumatic. Nose: No congestion/rhinnorhea. Mouth/Throat: Mucous membranes are moist.   Neck: No stridor. Cardiovascular: Normal rate, regular rhythm. Good peripheral circulation. Grossly normal heart sounds.   Respiratory: Normal respiratory effort.  No retractions. Lungs CTAB. Gastrointestinal: Soft and nontender. No distention.  Musculoskeletal: No lower extremity tenderness nor edema. No gross deformities of extremities. Neurologic:  Normal speech and language. No gross focal neurologic deficits are appreciated.  Skin:  Skin is warm and dry. 3 cm area of tenderness and fluctuance over the upper 1/3rd of the sternum. No cellulitis. No drainage.    ____________________________________________   PROCEDURES  Procedure(s) performed:   Marland KitchenIncision and Drainage  Date/Time: 07/25/2021 10:00 AM Performed by: 07/27/2021, MD Authorized by: Maia Plan, MD   Consent:    Consent obtained:  Verbal   Consent given by:  Patient   Risks, benefits, and alternatives were discussed: yes     Risks discussed:  Bleeding, damage to other organs, infection, incomplete drainage and pain   Alternatives discussed:  No treatment Universal protocol:    Patient identity confirmed:  Verbally with patient Location:    Type:  Abscess   Size:  3   Location:  Trunk   Trunk location:  Chest Pre-procedure details:    Skin  preparation:  Chlorhexidine Sedation:    Sedation type:  None Anesthesia:    Anesthesia method:  Local infiltration   Local anesthetic:  Lidocaine 2% WITH epi Procedure type:    Complexity:  Simple Procedure details:    Ultrasound guidance: no     Needle aspiration: no     Incision types:  Single straight   Incision depth:  Dermal   Wound management:  Probed and deloculated   Drainage:  Purulent   Drainage amount:  Moderate   Wound treatment:   Wound left open   Packing materials:  1/4 in iodoform gauze Post-procedure details:    Procedure completion:  Tolerated well, no immediate complications   ____________________________________________   INITIAL IMPRESSION / ASSESSMENT AND PLAN / ED COURSE  Pertinent labs & imaging results that were available during my care of the patient were reviewed by me and considered in my medical decision making (see chart for details).   Patient presents to the ED with abscess to the chest wall. Area appears superficial. No sepsis concern. I&D performed after obtaining verbal informed consent from the patient. Small amount of packing placed. Will send home on abx as well. Discussed wound care and ED return precautions.    ____________________________________________  FINAL CLINICAL IMPRESSION(S) / ED DIAGNOSES  Final diagnoses:  Abscess     MEDICATIONS GIVEN DURING THIS VISIT:  Medications  lidocaine-EPINEPHrine (XYLOCAINE W/EPI) 2 %-1:200000 (PF) injection 10 mL (10 mLs Infiltration Given by Other 07/21/21 1015)  Tdap (BOOSTRIX) injection 0.5 mL (0.5 mLs Intramuscular Given 07/21/21 0945)     NEW OUTPATIENT MEDICATIONS STARTED DURING THIS VISIT:  Discharge Medication List as of 07/21/2021 10:20 AM     START taking these medications   Details  doxycycline (VIBRAMYCIN) 100 MG capsule Take 1 capsule (100 mg total) by mouth 2 (two) times daily for 7 days., Starting Mon 07/21/2021, Until Mon 07/28/2021, Normal        Note:  This document was prepared using Dragon voice recognition software and may include unintentional dictation errors.  Alona Bene, MD, [facility] Emergency Medicine    Rohail Klees, Arlyss Repress, MD 07/25/21 1002

## 2023-09-26 ENCOUNTER — Encounter (HOSPITAL_BASED_OUTPATIENT_CLINIC_OR_DEPARTMENT_OTHER): Payer: Self-pay | Admitting: Emergency Medicine

## 2023-09-26 ENCOUNTER — Emergency Department (HOSPITAL_BASED_OUTPATIENT_CLINIC_OR_DEPARTMENT_OTHER): Payer: BC Managed Care – PPO

## 2023-09-26 ENCOUNTER — Emergency Department (HOSPITAL_BASED_OUTPATIENT_CLINIC_OR_DEPARTMENT_OTHER)
Admission: EM | Admit: 2023-09-26 | Discharge: 2023-09-26 | Disposition: A | Discharge status: Home / Self Care | Payer: BC Managed Care – PPO | Attending: Emergency Medicine | Admitting: Emergency Medicine

## 2023-09-26 ENCOUNTER — Other Ambulatory Visit: Payer: Self-pay

## 2023-09-26 DIAGNOSIS — Z1152 Encounter for screening for COVID-19: Secondary | ICD-10-CM | POA: Diagnosis not present

## 2023-09-26 DIAGNOSIS — D649 Anemia, unspecified: Secondary | ICD-10-CM | POA: Diagnosis not present

## 2023-09-26 DIAGNOSIS — J020 Streptococcal pharyngitis: Secondary | ICD-10-CM | POA: Diagnosis not present

## 2023-09-26 DIAGNOSIS — D72829 Elevated white blood cell count, unspecified: Secondary | ICD-10-CM | POA: Diagnosis not present

## 2023-09-26 DIAGNOSIS — J029 Acute pharyngitis, unspecified: Secondary | ICD-10-CM | POA: Diagnosis present

## 2023-09-26 HISTORY — DX: Polycystic ovarian syndrome: E28.2

## 2023-09-26 LAB — CBC WITH DIFFERENTIAL/PLATELET
Abs Immature Granulocytes: 0.07 10*3/uL (ref 0.00–0.07)
Basophils Absolute: 0 10*3/uL (ref 0.0–0.1)
Basophils Relative: 0 %
Eosinophils Absolute: 0.1 10*3/uL (ref 0.0–0.5)
Eosinophils Relative: 0 %
HCT: 33.2 % — ABNORMAL LOW (ref 36.0–46.0)
Hemoglobin: 10.8 g/dL — ABNORMAL LOW (ref 12.0–15.0)
Immature Granulocytes: 0 %
Lymphocytes Relative: 7 %
Lymphs Abs: 1.1 10*3/uL (ref 0.7–4.0)
MCH: 27.7 pg (ref 26.0–34.0)
MCHC: 32.5 g/dL (ref 30.0–36.0)
MCV: 85.1 fL (ref 80.0–100.0)
Monocytes Absolute: 0.7 10*3/uL (ref 0.1–1.0)
Monocytes Relative: 4 %
Neutro Abs: 14.4 10*3/uL — ABNORMAL HIGH (ref 1.7–7.7)
Neutrophils Relative %: 89 %
Platelets: 300 10*3/uL (ref 150–400)
RBC: 3.9 MIL/uL (ref 3.87–5.11)
RDW: 13.7 % (ref 11.5–15.5)
WBC: 16.4 10*3/uL — ABNORMAL HIGH (ref 4.0–10.5)
nRBC: 0 % (ref 0.0–0.2)

## 2023-09-26 LAB — HCG, QUANTITATIVE, PREGNANCY: hCG, Beta Chain, Quant, S: <1 m[IU]/mL (ref <5)

## 2023-09-26 LAB — I-STAT CHEM 8, ED
BUN: 8 mg/dL (ref 6–20)
Calcium, Ion: 1.2 mmol/L (ref 1.15–1.40)
Chloride: 102 mmol/L (ref 98–111)
Creatinine, Ser: 0.6 mg/dL (ref 0.44–1.00)
Glucose, Bld: 102 mg/dL — ABNORMAL HIGH (ref 70–99)
HCT: 33 % — ABNORMAL LOW (ref 36.0–46.0)
Hemoglobin: 11.2 g/dL — ABNORMAL LOW (ref 12.0–15.0)
Potassium: 3.9 mmol/L (ref 3.5–5.1)
Sodium: 138 mmol/L (ref 135–145)
TCO2: 26 mmol/L (ref 22–32)

## 2023-09-26 LAB — RESP PANEL BY RT-PCR (RSV, FLU A&B, COVID)  RVPGX2
Influenza A by PCR: NEGATIVE
Influenza B by PCR: NEGATIVE
Resp Syncytial Virus by PCR: NEGATIVE
SARS Coronavirus 2 by RT PCR: NEGATIVE

## 2023-09-26 LAB — GROUP A STREP BY PCR: Group A Strep by PCR: DETECTED — AB

## 2023-09-26 MED ORDER — ACETAMINOPHEN 325 MG PO TABS
650.0000 mg | ORAL_TABLET | Freq: Once | ORAL | Status: AC
  Administered 2023-09-26: 650 mg via ORAL
  Filled 2023-09-26: qty 2

## 2023-09-26 MED ORDER — IOHEXOL 300 MG/ML  SOLN
75.0000 mL | Freq: Once | INTRAMUSCULAR | Status: AC | PRN
  Administered 2023-09-26: 75 mL via INTRAVENOUS

## 2023-09-26 MED ORDER — SODIUM CHLORIDE 0.9 % IV BOLUS
1000.0000 mL | Freq: Once | INTRAVENOUS | Status: AC
  Administered 2023-09-26: 1000 mL via INTRAVENOUS

## 2023-09-26 MED ORDER — KETOROLAC TROMETHAMINE 15 MG/ML IJ SOLN
15.0000 mg | Freq: Once | INTRAMUSCULAR | Status: AC
  Administered 2023-09-26: 15 mg via INTRAVENOUS
  Filled 2023-09-26: qty 1

## 2023-09-26 MED ORDER — PENICILLIN G BENZATHINE 1200000 UNIT/2ML IM SUSY
1.2000 10*6.[IU] | PREFILLED_SYRINGE | Freq: Once | INTRAMUSCULAR | Status: AC
  Administered 2023-09-26: 1.2 10*6.[IU] via INTRAMUSCULAR
  Filled 2023-09-26: qty 2

## 2023-09-26 MED ORDER — PREDNISONE 20 MG PO TABS: 40.0000 mg | ORAL_TABLET | Freq: Every day | ORAL | 0 refills | Status: AC

## 2023-09-26 MED ORDER — LIDOCAINE VISCOUS HCL 2 % MT SOLN
15.0000 mL | Freq: Once | OROMUCOSAL | Status: AC
  Administered 2023-09-26: 15 mL via OROMUCOSAL
  Filled 2023-09-26: qty 15

## 2023-09-26 MED ORDER — DEXAMETHASONE SODIUM PHOSPHATE 10 MG/ML IJ SOLN
10.0000 mg | Freq: Once | INTRAMUSCULAR | Status: AC
  Administered 2023-09-26: 10 mg via INTRAVENOUS
  Filled 2023-09-26: qty 1

## 2023-09-26 NOTE — ED Provider Notes (Signed)
[place] [facility] Provider Note   CSN: [id number] Arrival date & time: 09/26/23  1222     History  Chief Complaint  Patient presents with   Sore Throat    Breseis Delman Heidecker is a 32 y.o. female with a past medical history significant for PCOS and GERD who presents to the ED due to sore throat that started yesterday.  Patient admits to difficulties swallowing secondary to pain and changes to phonation which she describes as a muffled voice.  Also admits to some nasal congestion.  Denies fever and chills.  Denies cough.  No shortness of breath. Notes this feels similar to her previous episode of strep throat.   History obtained from patient and past medical records. No interpreter used during encounter.       Home Medications Prior to Admission medications   Medication Sig Start Date End Date Taking? Authorizing Provider  predniSONE (DELTASONE) 20 MG tablet Take 2 tablets (40 mg total) by mouth daily for 5 days. 09/26/23 10/01/23 Yes Dayona Shaheen, Merla Riches, PA-C  Multiple Vitamin (MULTIVITAMIN ADULT PO) Take by mouth.    [provider]  omeprazole (PRILOSEC) 20 MG capsule omeprazole 20 mg capsule,delayed release  Take 1 capsule every day by oral route.    [provider]  Turmeric (QC TUMERIC COMPLEX PO) turmeric (bulk)  2 capsules daily    [provider]      Allergies    Nicotine    Review of Systems   Review of Systems  Constitutional:  Negative for chills and fever.  HENT:  Positive for congestion, sore throat and trouble swallowing.   Respiratory:  Negative for cough and shortness of breath.     Physical Exam Updated Vital Signs BP 124/75   Pulse (!) 102   Temp 98.5 F (36.9 C) (Oral)   Resp 18   Ht 5\' 6"  (1.676 m)   Wt (!) 145.6 kg   LMP  (LMP Unknown) Comment: PCOS  SpO2 97%   BMI 51.81 kg/m  Physical Exam Vitals and nursing note reviewed.  Constitutional:      General: She is not in acute  distress.    Appearance: She is not ill-appearing.  HENT:     Head: Normocephalic.     Mouth/Throat:     Comments: 2+ tonsillar hypertrophy. Uvula midline. Tolerating oral secretions without difficulty.  Eyes:     Pupils: Pupils are equal, round, and reactive to light.  Neck:     Comments: No meningismus. Cardiovascular:     Rate and Rhythm: Normal rate and regular rhythm.     Pulses: Normal pulses.     Heart sounds: Normal heart sounds. No murmur heard.    No friction rub. No gallop.  Pulmonary:     Effort: Pulmonary effort is normal.     Breath sounds: Normal breath sounds.  Abdominal:     General: Abdomen is flat. There is no distension.     Palpations: Abdomen is soft.     Tenderness: There is no abdominal tenderness. There is no guarding or rebound.  Musculoskeletal:        General: Normal range of motion.     Cervical back: Neck supple.  Skin:    General: Skin is warm and dry.  Neurological:     General: No focal deficit present.     Mental Status: She is alert.  Psychiatric:        Mood and Affect: Mood normal.  Behavior: Behavior normal.     ED Results / Procedures / Treatments   Labs (all labs ordered are listed, but only abnormal results are displayed) Labs Reviewed  GROUP A STREP BY PCR - Abnormal; Notable for the following components:      Result Value   Group A Strep by PCR DETECTED (*)    All other components within normal limits  CBC WITH DIFFERENTIAL/PLATELET - Abnormal; Notable for the following components:   WBC 16.4 (*)    Hemoglobin 10.8 (*)    HCT 33.2 (*)    Neutro Abs 14.4 (*)    All other components within normal limits  I-STAT CHEM 8, ED - Abnormal; Notable for the following components:   Glucose, Bld 102 (*)    Hemoglobin 11.2 (*)    HCT 33.0 (*)    All other components within normal limits  RESP PANEL BY RT-PCR (RSV, FLU A&B, COVID)  RVPGX2  HCG, QUANTITATIVE, PREGNANCY    EKG None  Radiology CT Soft Tissue Neck W  Contrast  Result Date: 09/26/2023 CLINICAL DATA:  Epiglottitis or tonsillitis suspected. Sore throat since yesterday. Multiple voice. Difficulty handling secretions. EXAM: CT NECK WITH CONTRAST TECHNIQUE: Multidetector CT imaging of the neck was performed using the standard protocol following the bolus administration of intravenous contrast. RADIATION DOSE REDUCTION: This exam was performed according to the departmental dose-optimization program which includes automated exposure control, adjustment of the mA and/or kV according to patient size and/or use of iterative reconstruction technique. CONTRAST:  75mL OMNIPAQUE IOHEXOL 300 MG/ML  SOLN COMPARISON:  None Available. FINDINGS: Pharynx and larynx: Moderate adenoid hypertrophy is present. Adenoid tissue measures 20 mm from the clivus. No discrete mass lesion is present. The palatine tonsils are enlarged bilaterally. The nasopharyngeal airway is narrowed without occlusion. The oro pharyngeal airway is patent. Mild prominence of lingual tonsils is present. No discrete lesion is present. Vallecula and epiglottis are within normal limits. Aryepiglottic folds and piriform sinuses are clear. Vocal cords are midline and symmetric. Trachea is clear. Salivary glands: The submandibular and parotid glands and ducts are within normal limits. Thyroid: Normal. Lymph nodes: No significant cervical adenopathy is present. Vascular: No significant vascular disease is present. Limited intracranial: Within normal limits. Visualized orbits: The globes and orbits are within normal limits. Mastoids and visualized paranasal sinuses: The paranasal sinuses and mastoid air cells are clear. Skeleton: The vertebral body heights and alignment are normal. Straightening and some reversal of the normal cervical lordosis is noted. Upper chest: The lung apices are clear. The thoracic inlet is within normal limits. IMPRESSION: 1. Moderate adenoid hypertrophy and enlargement of the palatine tonsils  bilaterally. 2. The nasopharyngeal airway is narrowed without occlusion. 3. No discrete mass lesion is present. 4. No significant cervical adenopathy. 5. Straightening and some reversal of the normal cervical lordosis is nonspecific, but can be seen in the setting of muscle spasm. Electronically Signed   By: Marin Roberts M.D.   On: 09/26/2023 14:59    Procedures Procedures    Medications Ordered in ED Medications  dexamethasone (DECADRON) injection 10 mg (10 mg Intravenous Given 09/26/23 1309)  ketorolac (TORADOL) 15 MG/ML injection 15 mg (15 mg Intravenous Given 09/26/23 1309)  lidocaine (XYLOCAINE) 2 % viscous mouth solution 15 mL (15 mLs Mouth/Throat Given 09/26/23 1309)  penicillin g benzathine (BICILLIN LA) 1200000 UNIT/2ML injection 1.2 Million Units (1.2 Million Units Intramuscular Given 09/26/23 1420)  sodium chloride 0.9 % bolus 1,000 mL (1,000 mLs Intravenous New Bag/Given 09/26/23 1422)  acetaminophen (TYLENOL) tablet 650 mg (650 mg Oral Given 09/26/23 1427)  iohexol (OMNIPAQUE) 300 MG/ML solution 75 mL (75 mLs Intravenous Contrast Given 09/26/23 1438)    ED Course/ Medical Decision Making/ A&P Clinical Course as of 09/26/23 1535  Sun Sep 26, 2023  1355 Group A Strep by PCR(!): DETECTED [CA]  1355 WBC(!): 16.4 [CA]  1422 Reassessed patient at bedside.  Patient still has muffled phonation.  CT ordered to rule out evidence of abscess.  IV fluids given.  Heart rate has improved to lower 100s on cardiac monitoring. [CA]  1523 Reassessed patient at bedside.  Heart rate has normalized.  Patient feels much improved.  Patient able to tolerate p.o. without difficulty.  Patient stable for discharge. [CA]    Clinical Course User Index [CA] Mannie Stabile, PA-C                                 Medical Decision Making Amount and/or Complexity of Data Reviewed Labs: ordered. Decision-making details documented in ED Course. Radiology: ordered and independent interpretation  performed. Decision-making details documented in ED Course.  Risk OTC drugs. Prescription drug management.   This patient presents to the ED for concern of sore throat, this involves an extensive number of treatment options, and is a complaint that carries with it a high risk of complications and morbidity.  The differential diagnosis includes strep, abscess, COVID, flu, etc  32 year old female presents to the ED due to sore throat associated with difficulty swallowing and changes to phonation that started yesterday.  Notes it feels similar to her previous episode of strep throat.  No shortness of breath.  Upon arrival patient borderline febrile to 100.3 F and tachycardic at 118.  Patient in no acute distress.  2+ tonsillar hypertrophy bilaterally.  Uvula midline.  No obvious abscess.  No meningismus to suggest meningitis.  Lungs clear to auscultation bilaterally without stridor or wheeze.  Routine labs ordered.  Strep, COVID, and flu test ordered.  Decadron and Toradol given.   CBC significant for leukocytosis at 16.4.  Anemia with hemoglobin 10.8.  strep positive.  Patient nontoxic-appearing on exam.  Suspect leukocytosis, tachycardia, and fever likely secondary to strep throat.  Patient treated with Bicillin and IV fluids in the ED with resolution in tachycardia.  COVID/influenza negative.  CT soft tissue neck personally reviewed and interpreted which demonstrates moderate adenoid hypertrophy and enlargement of the palatine tonsils bilaterally.  No abscess.  Patient able to tolerate p.o. at bedside.  No evidence of airway compromise.  No stridor or wheeze.  Patient had some improvement after Decadron and Toradol here in the ED.  Will discharge patient with short burst of prednisone.  Advised patient to return to the ED if she has any difficulties breathing or any worsening symptoms.  Patient stable for discharge. Strict ED precautions discussed with patient. Patient states understanding and agrees to  plan. Patient discharged home in no acute distress and stable vitals  Co morbidities that complicate the patient evaluation  GERD, PCOS   Social Determinants of Health:  No PCP on file  Test / Admission - Considered:  Considered admission for observation; however patient had significant improvement after decadron and Toradol in the ED. No abscess on CT. No signs of airway compromise. Felt patient was stable for discharge home and return for any worsening symptoms.         Final Clinical Impression(s) / ED Diagnoses Final  diagnoses:  Strep pharyngitis    Rx / DC Orders ED Discharge Orders          Ordered    predniSONE (DELTASONE) 20 MG tablet  Daily        09/26/23 1534              Jesusita Oka 09/26/23 1535    Pricilla Loveless, MD 09/29/23 [phone]

## 2023-09-26 NOTE — Discharge Instructions (Addendum)
It was a pleasure taking care of you today.  As discussed, your strep test was positive.  You were treated for strep throat here in the ER.  I am sending you home with steroids for the next 5 days.  You may take over-the-counter ibuprofen or Tylenol as needed for pain.  Return to the ER if you have any difficulties breathing, swallowing, or worsening of symptoms.

## 2023-09-26 NOTE — ED Notes (Signed)
ED Provider at bedside. 

## 2023-09-26 NOTE — ED Triage Notes (Signed)
Pt with sore throat since yesterday; muffled voice and difficulty maintaining saliva

## 2023-09-26 NOTE — ED Notes (Signed)
Patient transported to CT via WC, tol well.

## 2023-11-27 ENCOUNTER — Encounter (HOSPITAL_COMMUNITY): Payer: Self-pay

## 2023-11-27 ENCOUNTER — Other Ambulatory Visit: Payer: Self-pay

## 2023-11-27 ENCOUNTER — Emergency Department (HOSPITAL_COMMUNITY): Payer: Self-pay

## 2023-11-27 ENCOUNTER — Emergency Department (HOSPITAL_COMMUNITY)
Admission: EM | Admit: 2023-11-27 | Discharge: 2023-11-27 | Disposition: A | Discharge status: Home / Self Care | Payer: Self-pay | Attending: Emergency Medicine | Admitting: Emergency Medicine

## 2023-11-27 DIAGNOSIS — D72829 Elevated white blood cell count, unspecified: Secondary | ICD-10-CM | POA: Insufficient documentation

## 2023-11-27 DIAGNOSIS — R197 Diarrhea, unspecified: Secondary | ICD-10-CM | POA: Insufficient documentation

## 2023-11-27 DIAGNOSIS — R112 Nausea with vomiting, unspecified: Secondary | ICD-10-CM | POA: Insufficient documentation

## 2023-11-27 LAB — CBC
HCT: 40.9 % (ref 36.0–46.0)
Hemoglobin: 12.9 g/dL (ref 12.0–15.0)
MCH: 27.8 pg (ref 26.0–34.0)
MCHC: 31.5 g/dL (ref 30.0–36.0)
MCV: 88.1 fL (ref 80.0–100.0)
Platelets: 350 10*3/uL (ref 150–400)
RBC: 4.64 MIL/uL (ref 3.87–5.11)
RDW: 13.8 % (ref 11.5–15.5)
WBC: 14.3 10*3/uL — ABNORMAL HIGH (ref 4.0–10.5)
nRBC: 0 % (ref 0.0–0.2)

## 2023-11-27 LAB — COMPREHENSIVE METABOLIC PANEL
ALT: 22 U/L (ref 0–44)
AST: 17 U/L (ref 15–41)
Albumin: 3.9 g/dL (ref 3.5–5.0)
Alkaline Phosphatase: 97 U/L (ref 38–126)
Anion gap: 10 (ref 5–15)
BUN: 12 mg/dL (ref 6–20)
CO2: 24 mmol/L (ref 22–32)
Calcium: 9.3 mg/dL (ref 8.9–10.3)
Chloride: 103 mmol/L (ref 98–111)
Creatinine, Ser: 0.66 mg/dL (ref 0.44–1.00)
GFR, Estimated: >60 mL/min (ref >60)
Glucose, Bld: 107 mg/dL — ABNORMAL HIGH (ref 70–99)
Potassium: 3.5 mmol/L (ref 3.5–5.1)
Sodium: 137 mmol/L (ref 135–145)
Total Bilirubin: 0.9 mg/dL (ref 0.0–1.2)
Total Protein: 7.9 g/dL (ref 6.5–8.1)

## 2023-11-27 LAB — HCG, SERUM, QUALITATIVE: Preg, Serum: NEGATIVE

## 2023-11-27 LAB — LIPASE, BLOOD: Lipase: 25 U/L (ref 11–51)

## 2023-11-27 MED ORDER — FLUCONAZOLE 200 MG PO TABS: 200.0000 mg | ORAL_TABLET | ORAL | 0 refills | Status: AC

## 2023-11-27 MED ORDER — IOHEXOL 300 MG/ML  SOLN
100.0000 mL | Freq: Once | INTRAMUSCULAR | Status: AC | PRN
  Administered 2023-11-27: 100 mL via INTRAVENOUS

## 2023-11-27 MED ORDER — ONDANSETRON HCL 4 MG/2ML IJ SOLN
4.0000 mg | Freq: Once | INTRAMUSCULAR | Status: AC
  Administered 2023-11-27: 4 mg via INTRAVENOUS
  Filled 2023-11-27: qty 2

## 2023-11-27 MED ORDER — ONDANSETRON HCL 4 MG PO TABS: 4.0000 mg | ORAL_TABLET | Freq: Four times a day (QID) | ORAL | 0 refills | Status: AC

## 2023-11-27 NOTE — Discharge Instructions (Addendum)
You were evaluated in the emergency room for vomiting and diarrhea.  Your white blood cell count was a little elevated.  This is often the case in the setting of an infection.  Otherwise your lab work and CT scan did not show any significant abnormality.  You are provided a prescription for Zofran, a medication for nausea.  If you experience any new or worsening symptoms including persistent fevers, vomiting and diarrhea please return to the emergency room.

## 2023-11-27 NOTE — ED Provider Triage Note (Signed)
Emergency Medicine Provider Triage Evaluation Note  Christine Heath , a 33 y.o. female  was evaluated in triage.  Pt complains of 2 days of periumbilical abdominal pain with nausea/vomiting/diarrhea which began at approximately 6 PM last night.  Patient was near syncopal in triage, unclear if it was a possible reaction to the needlestick  Review of Systems  Positive:  Negative:   Physical Exam  BP 125/82 (BP Location: Right Arm)   Pulse (!) 106   Temp 99.9 F (37.7 C) (Oral)   Resp 16   Wt (!) 151 kg   LMP  (LMP Unknown)   SpO2 97%   BMI 53.75 kg/m  Gen:   Awake, no distress   Resp:  Normal effort  MSK:   Moves extremities without difficulty  Other:    Medical Decision Making  Medically screening exam initiated at 6:12 AM.  Appropriate orders placed.  Christine Heath was informed that the remainder of the evaluation will be completed by another provider, this initial triage assessment does not replace that evaluation, and the importance of remaining in the ED until their evaluation is complete.     Darrick Grinder, [place] 11/27/23 [phone]

## 2023-11-27 NOTE — ED Provider Notes (Addendum)
EMERGENCY DEPARTMENT AT [facility] Provider Note   CSN: [id number] Arrival date & time: 11/27/23  0543     History  Chief Complaint  Patient presents with   Emesis    Christine Heath is a 33 y.o. female who presents with vomiting diarrhea that started yesterday.  States she is had at least 10 episodes of emesis and diarrhea.  Pain diarrhea at this point is watery.  She denies any abdominal pain.  No one at home is sick.  Reports that she was on multiple antibiotics for resistant strep throat within the past month.  Denies any history of C. difficile.  She is unable to recall what antibiotic she was on.   Emesis     Past Medical History:  Diagnosis Date   Boils    GERD (gastroesophageal reflux disease)    PCOS (polycystic ovarian syndrome)      Home Medications Prior to Admission medications   Medication Sig Start Date End Date Taking? Authorizing Provider  fluconazole (DIFLUCAN) 200 MG tablet Take 1 tablet (200 mg total) by mouth every 3 (three) days for 3 doses. 11/27/23 12/04/23 Yes Halford Decamp, PA-C  ondansetron (ZOFRAN) 4 MG tablet Take 1 tablet (4 mg total) by mouth every 6 (six) hours. 11/27/23  Yes Halford Decamp, PA-C  Multiple Vitamin (MULTIVITAMIN ADULT PO) Take by mouth.    [provider]  omeprazole (PRILOSEC) 20 MG capsule omeprazole 20 mg capsule,delayed release  Take 1 capsule every day by oral route.    [provider]  Turmeric (QC TUMERIC COMPLEX PO) turmeric (bulk)  2 capsules daily    [provider]      Allergies    Nicotine    Review of Systems   Review of Systems  Gastrointestinal:  Positive for vomiting.    Physical Exam Updated Vital Signs BP 130/70 (BP Location: Left Arm)   Pulse 97   Temp 99.2 F (37.3 C) (Oral)   Resp 18   Wt (!) 151 kg   LMP  (LMP Unknown)   SpO2 98%   BMI 53.75 kg/m  Physical Exam Vitals and nursing note reviewed.  Constitutional:      General: She is  not in acute distress.    Appearance: She is well-developed.  HENT:     Head: Normocephalic and atraumatic.  Eyes:     Conjunctiva/sclera: Conjunctivae normal.  Cardiovascular:     Rate and Rhythm: Normal rate and regular rhythm.     Heart sounds: No murmur heard. Pulmonary:     Effort: Pulmonary effort is normal. No respiratory distress.     Breath sounds: Normal breath sounds.  Abdominal:     Palpations: Abdomen is soft.     Tenderness: There is no abdominal tenderness.  Musculoskeletal:        General: No swelling.     Cervical back: Neck supple.  Skin:    General: Skin is warm and dry.     Capillary Refill: Capillary refill takes less than 2 seconds.  Neurological:     Mental Status: She is alert.  Psychiatric:        Mood and Affect: Mood normal.     ED Results / Procedures / Treatments   Labs (all labs ordered are listed, but only abnormal results are displayed) Labs Reviewed  COMPREHENSIVE METABOLIC PANEL - Abnormal; Notable for the following components:      Result Value   Glucose, Bld 107 (*)  All other components within normal limits  CBC - Abnormal; Notable for the following components:   WBC 14.3 (*)    All other components within normal limits  LIPASE, BLOOD  HCG, SERUM, QUALITATIVE  URINALYSIS, ROUTINE W REFLEX MICROSCOPIC    EKG EKG Interpretation Date/Time:  Saturday November 27 2023 06:55:15 EST Ventricular Rate:  89 PR Interval:  153 QRS Duration:  90 QT Interval:  363 QTC Calculation: 442 R Axis:   -15  Text Interpretation: Sinus rhythm Borderline left axis deviation Abnormal inferior Q waves Confirmed by Alvester Chou [phone]) on 11/27/2023 11:39:47 AM  Radiology CT ABDOMEN PELVIS W CONTRAST Result Date: 11/27/2023 CLINICAL DATA:  Abdominal pain nonlocalized EXAM: CT ABDOMEN AND PELVIS WITH CONTRAST TECHNIQUE: Multidetector CT imaging of the abdomen and pelvis was performed using the standard protocol following bolus administration of  intravenous contrast. RADIATION DOSE REDUCTION: This exam was performed according to the departmental dose-optimization program which includes automated exposure control, adjustment of the mA and/or kV according to patient size and/or use of iterative reconstruction technique. CONTRAST:  OMNIPAQUE IOHEXOL 300 MG/ML  SOLN COMPARISON:  04/28/2023 [facility] [place] FINDINGS: Lower chest: Clear lung bases. Normal heart size without pericardial or pleural effusion. Hepatobiliary: Hepatomegaly at 19.8 cm craniocaudal. Possible mild hepatic steatosis. Normal gallbladder, without biliary ductal dilatation. Pancreas: Normal, without mass or ductal dilatation. Spleen: Normal in size, without focal abnormality. Adrenals/Urinary Tract: Normal adrenal glands. Normal kidneys, without hydronephrosis. Normal urinary bladder. Stomach/Bowel: Normal stomach, without wall thickening. Normal colon, appendix, and terminal ileum. Normal small bowel. Vascular/Lymphatic: Normal caliber of the aorta and branch vessels. No abdominopelvic adenopathy. Reproductive: Small nabothian cyst.  No adnexal mass. Other: No significant free fluid.  No free intraperitoneal air. Musculoskeletal: No acute osseous abnormality. IMPRESSION: No acute process or explanation for abdominal pain. Hepatomegaly and possible mild hepatic steatosis. Electronically Signed   By: Jeronimo Greaves M.D.   On: 11/27/2023 12:12    Procedures Procedures    Medications Ordered in ED Medications  iohexol (OMNIPAQUE) 300 MG/ML solution 100 mL (100 mLs Intravenous Contrast Given 11/27/23 1153)  ondansetron (ZOFRAN) injection 4 mg (4 mg Intravenous Given 11/27/23 1426)    ED Course/ Medical Decision Making/ A&P                                 Medical Decision Making Risk Prescription drug management.   This patient presents to the ED with chief complaint(s) of vomiting diarrhea.  The complaint involves an extensive differential diagnosis and also carries  with it a high risk of complications and morbidity.   pertinent past medical history as listed in HPI  The differential diagnosis includes  gastroenteritis, colitis, small bowel obstruction, appendicitis, cholecystitis, hepatobiliary pathology, gastritis, PUD, ACS, aortic dissection, diverticulosis/diverticulitis, pancreatitis, nephrolithiasis, AAA, UTI, pyelonephritis  Additional history obtained: Records reviewed previous admission documents and Care Everywhere/External Records  Initial Assessment:   Patient presents tachycardic to 115, she is otherwise hemodynamically stable with complaints of vomiting diarrhea x 1 day.  On exam her abdomen is completely nontender.  She endorses poor p.o. intake.  Her CT scan does not show any significant abnormality.  She does have a notable leukocytosis.  She has recently been on multiple antibiotics, however does not have a history of C. Difficile.  Chart review demonstrates that she she received Bicillin initially, followed by 10 days of Augmentin, with persistent symptoms was started on clindamycin on 12/24.  Although she is certainly at risk for C. difficile, presence of nausea and vomiting as well is more suspicious for gastroenteritis.  Independent ECG interpretation:  Sinus rhythm with subtle Q wave in lead II and aVF  Independent labs interpretation:  The following labs were independently interpreted:   hCG negative, CBC with leukocytosis of 1418.3, C MP without significant abnormality, lipase within normal limits  Independent visualization and interpretation of imaging: I independently visualized the following imaging with scope of interpretation limited to determining acute life threatening conditions related to emergency care: CT abdomen pelvis, which revealed no acute abnormality  Treatment and Reassessment: Patient given Zofran 4 mg following first assessment Upon reassessment patient reports she has been able to tolerate p.o.  Her vitals are  within normal limits.  Discussed discharge plan with prescription for Zofran.  Patient is agreeable.  Reviewed increased risk for C. difficile given multiple numerous antibiotics including clindamycin.  Patient is aware that she has persistent diarrhea over the neck several days to return to the emergency room for further evaluation.  Consultations obtained:   None  Disposition:   Patient will be discharged home.  Provided prescription for Zofran.  Patient also reports has a candidiasis infection.  Requesting fluconazole.  Prescription sent into pharmacy. The patient has been appropriately medically screened and/or stabilized in the ED. I have low suspicion for any other emergent medical condition which would require further screening, evaluation or treatment in the ED or require inpatient management. At time of discharge the patient is hemodynamically stable and in no acute distress. I have discussed work-up results and diagnosis with patient and answered all questions. Patient is agreeable with discharge plan. We discussed strict return precautions for returning to the emergency department and they verbalized understanding.     Social Determinants of Health:   none  This note was dictated with voice recognition software.  Despite best efforts at proofreading, errors may have occurred which can change the documentation meaning.          Final Clinical Impression(s) / ED Diagnoses Final diagnoses:  Nausea vomiting and diarrhea    Rx / DC Orders ED Discharge Orders          Ordered    ondansetron (ZOFRAN) 4 MG tablet  Every 6 hours        11/27/23 1611    fluconazole (DIFLUCAN) 200 MG tablet  every 72 hours        11/27/23 1631              Halford Decamp, PA-C 11/27/23 1613    Halford Decamp, PA-C 11/27/23 1633    Wynetta Fines, MD 11/27/23 2116

## 2023-11-27 NOTE — ED Triage Notes (Signed)
BIB EMS/ N/V/D x2 days/ no fever/ gen ABD pain/ ambulatory./ A&Ox4
